# Patient Record
Sex: Female | Born: 1935 | Race: White | Hispanic: No | Smoking: Former smoker
Health system: Southern US, Community
[De-identification: ages and names within clinical notes are randomized; demographics above are authoritative.]

## PROBLEM LIST (undated history)

## (undated) DIAGNOSIS — Z96659 Presence of unspecified artificial knee joint: Secondary | ICD-10-CM

## (undated) DIAGNOSIS — I10 Essential (primary) hypertension: Secondary | ICD-10-CM

## (undated) DIAGNOSIS — M24542 Contracture, left hand: Secondary | ICD-10-CM

## (undated) DIAGNOSIS — M720 Palmar fascial fibromatosis [Dupuytren]: Secondary | ICD-10-CM

## (undated) DIAGNOSIS — E782 Mixed hyperlipidemia: Secondary | ICD-10-CM

## (undated) DIAGNOSIS — M810 Age-related osteoporosis without current pathological fracture: Secondary | ICD-10-CM

## (undated) DIAGNOSIS — I340 Nonrheumatic mitral (valve) insufficiency: Secondary | ICD-10-CM

## (undated) DIAGNOSIS — H3563 Retinal hemorrhage, bilateral: Secondary | ICD-10-CM

## (undated) DIAGNOSIS — I6330 Cerebral infarction due to thrombosis of unspecified cerebral artery: Secondary | ICD-10-CM

## (undated) DIAGNOSIS — R071 Chest pain on breathing: Secondary | ICD-10-CM

## (undated) DIAGNOSIS — H35372 Puckering of macula, left eye: Secondary | ICD-10-CM

## (undated) DIAGNOSIS — I110 Hypertensive heart disease with heart failure: Secondary | ICD-10-CM

## (undated) DIAGNOSIS — Z950 Presence of cardiac pacemaker: Secondary | ICD-10-CM

## (undated) DIAGNOSIS — M1990 Unspecified osteoarthritis, unspecified site: Secondary | ICD-10-CM

## (undated) DIAGNOSIS — J449 Chronic obstructive pulmonary disease, unspecified: Secondary | ICD-10-CM

## (undated) DIAGNOSIS — I5032 Chronic diastolic (congestive) heart failure: Secondary | ICD-10-CM

## (undated) DIAGNOSIS — K219 Gastro-esophageal reflux disease without esophagitis: Secondary | ICD-10-CM

## (undated) DIAGNOSIS — Z961 Presence of intraocular lens: Secondary | ICD-10-CM

## (undated) DIAGNOSIS — H43813 Vitreous degeneration, bilateral: Secondary | ICD-10-CM

## (undated) DIAGNOSIS — H53001 Unspecified amblyopia, right eye: Secondary | ICD-10-CM

## (undated) DIAGNOSIS — G250 Essential tremor: Secondary | ICD-10-CM

## (undated) DIAGNOSIS — H35033 Hypertensive retinopathy, bilateral: Secondary | ICD-10-CM

## (undated) DIAGNOSIS — I482 Chronic atrial fibrillation, unspecified: Secondary | ICD-10-CM

## (undated) DIAGNOSIS — H348322 Tributary (branch) retinal vein occlusion, left eye, stable: Secondary | ICD-10-CM

## (undated) DIAGNOSIS — G4734 Idiopathic sleep related nonobstructive alveolar hypoventilation: Secondary | ICD-10-CM

## (undated) DIAGNOSIS — J309 Allergic rhinitis, unspecified: Secondary | ICD-10-CM

## (undated) DIAGNOSIS — R609 Edema, unspecified: Secondary | ICD-10-CM

## (undated) HISTORY — DX: Contracture, left hand: M24.542

## (undated) HISTORY — DX: Palmar fascial fibromatosis (dupuytren): M72.0

## (undated) HISTORY — PX: FEMUR FRACTURE SURGERY: SHX633

## (undated) HISTORY — PX: TUBAL LIGATION: SHX77

## (undated) HISTORY — PX: PACEMAKER INSERTION: SHX728

## (undated) HISTORY — DX: Essential tremor: G25.0

## (undated) HISTORY — DX: Mixed hyperlipidemia: E78.2

## (undated) HISTORY — DX: Puckering of macula, left eye: H35.372

## (undated) HISTORY — DX: Gastro-esophageal reflux disease without esophagitis: K21.9

## (undated) HISTORY — DX: Edema, unspecified: R60.9

## (undated) HISTORY — DX: Hypertensive heart disease with heart failure: I11.0

## (undated) HISTORY — PX: BACK SURGERY: SHX140

## (undated) HISTORY — DX: Allergic rhinitis, unspecified: J30.9

## (undated) HISTORY — DX: Retinal hemorrhage, bilateral: H35.63

## (undated) HISTORY — DX: Tributary (branch) retinal vein occlusion, left eye, stable: H34.8322

## (undated) HISTORY — DX: Hypertensive retinopathy, bilateral: H35.033

## (undated) HISTORY — PX: SKIN GRAFT: SHX250

## (undated) HISTORY — DX: Age-related osteoporosis without current pathological fracture: M81.0

## (undated) HISTORY — PX: FINGER SURGERY: SHX640

## (undated) HISTORY — DX: Unspecified amblyopia, right eye: H53.001

## (undated) HISTORY — DX: Presence of unspecified artificial knee joint: Z96.659

## (undated) HISTORY — PX: OTHER SURGICAL HISTORY: SHX169

## (undated) HISTORY — DX: Chronic diastolic (congestive) heart failure: I50.32

## (undated) HISTORY — DX: Essential (primary) hypertension: I10

## (undated) HISTORY — DX: Presence of intraocular lens: Z96.1

## (undated) HISTORY — DX: Chronic atrial fibrillation, unspecified: I48.20

## (undated) HISTORY — DX: Chest pain on breathing: R07.1

## (undated) HISTORY — DX: Idiopathic sleep related nonobstructive alveolar hypoventilation: G47.34

## (undated) HISTORY — DX: Nonrheumatic mitral (valve) insufficiency: I34.0

## (undated) HISTORY — PX: REVISION TOTAL KNEE ARTHROPLASTY: SUR1280

## (undated) HISTORY — DX: Presence of cardiac pacemaker: Z95.0

## (undated) HISTORY — PX: ABDOMINAL HYSTERECTOMY: SHX81

## (undated) HISTORY — DX: Chronic obstructive pulmonary disease, unspecified: J44.9

## (undated) HISTORY — DX: Vitreous degeneration, bilateral: H43.813

## (undated) HISTORY — PX: TONSILLECTOMY: SUR1361

## (undated) HISTORY — DX: Unspecified osteoarthritis, unspecified site: M19.90

## (undated) HISTORY — DX: Cerebral infarction due to thrombosis of unspecified cerebral artery: I63.30

## (undated) HISTORY — PX: REPLACEMENT TOTAL KNEE: SUR1224

---

## 2006-01-14 ENCOUNTER — Encounter: Admission: RE | Admit: 2006-01-14 | Discharge: 2006-01-14 | Payer: Self-pay | Admitting: Orthopedic Surgery

## 2007-01-07 ENCOUNTER — Encounter: Admission: RE | Admit: 2007-01-07 | Discharge: 2007-01-07 | Payer: Self-pay | Admitting: Orthopedic Surgery

## 2007-01-14 ENCOUNTER — Encounter: Admission: RE | Admit: 2007-01-14 | Discharge: 2007-01-14 | Payer: Self-pay | Admitting: Orthopedic Surgery

## 2007-01-27 ENCOUNTER — Encounter: Admission: RE | Admit: 2007-01-27 | Discharge: 2007-01-27 | Payer: Self-pay | Admitting: Orthopedic Surgery

## 2007-02-14 ENCOUNTER — Encounter: Admission: RE | Admit: 2007-02-14 | Discharge: 2007-02-14 | Payer: Self-pay | Admitting: Orthopedic Surgery

## 2007-03-23 IMAGING — RF DG MYELOGRAM LUMBAR
13 series · 13 of 13 positions shown · IV contrast (omnipaque)
Comparison: none

CLINICAL DATA: Left low back pain.  
 LUMBAR MYELOGRAM:
 Lumbar puncture was performed from a right sided approach to midline at the L2-3 interlaminar spaces using a 22 gauge spinal needle.  18 cc of Omnipaque 180 were instilled.
 The spinal canal appears widely patent throughout.  Root sleeves fill normally bilaterally.  No extradural defects of significance.  There are minimal disk bulges at L3-4, L4-5, and L5-S1.
TECHNIQUE: Multidetector CT imaging of the lumbar spine was performed after intrathecal injection of contrast.  Multiplanar CT image reconstructions were also generated.
 T12-L1:  Normal interspace.
 L1-2:  Normal interspace.
 L2-3:  Minimal disk bulge but no herniation or stenosis.  
 L3-4:  Superior endplate Schmorl?s node at L3-4.  Mild disk bulge with slight prominence in the left foraminal region.  It would be conceivable that the left L3 nerve root could be irritated, but distinct compression is not established.
 L4-5:  Minimal disk bulge but no herniation or stenosis. 
 L5-S1:  Disk degeneration with  minimal bulge.  No effect upon the thecal sac or S1 nerve roots.  Foramina appear sufficiently patent.

[Series 1: (hospital) · 1 of 1 slices shown]
[im 1/1]
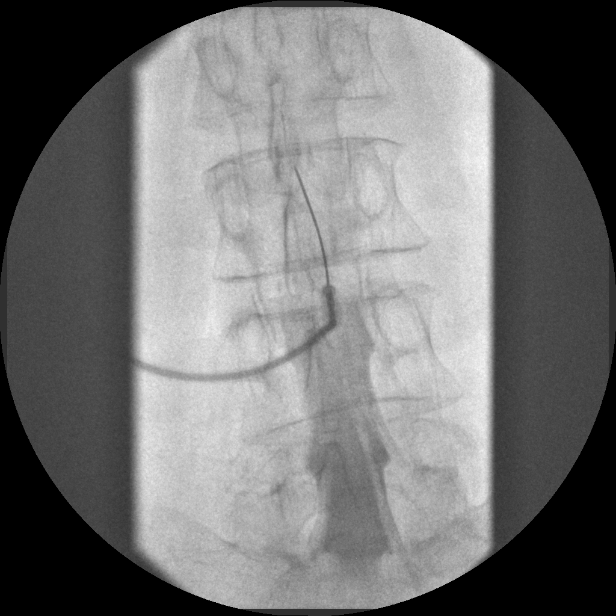

[Series 2: myelogram  white · 1 of 1 slices shown (1 of 12)]
[im 1/1]
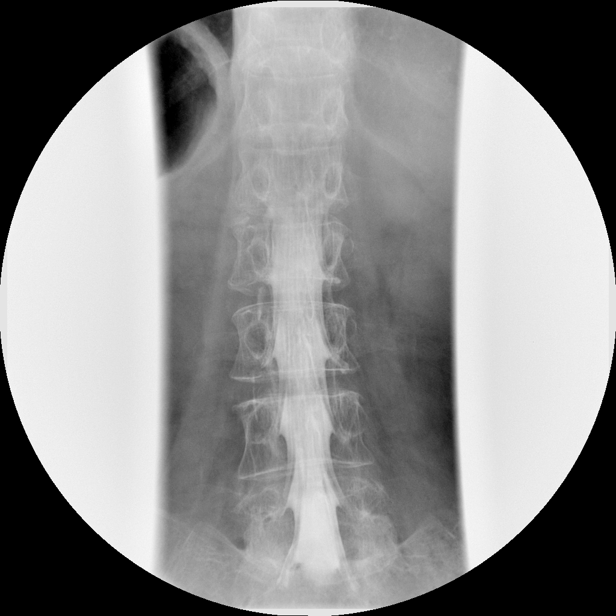

[Series 3: myelogram  white · 1 of 1 slices shown (2 of 12)]
[im 1/1]
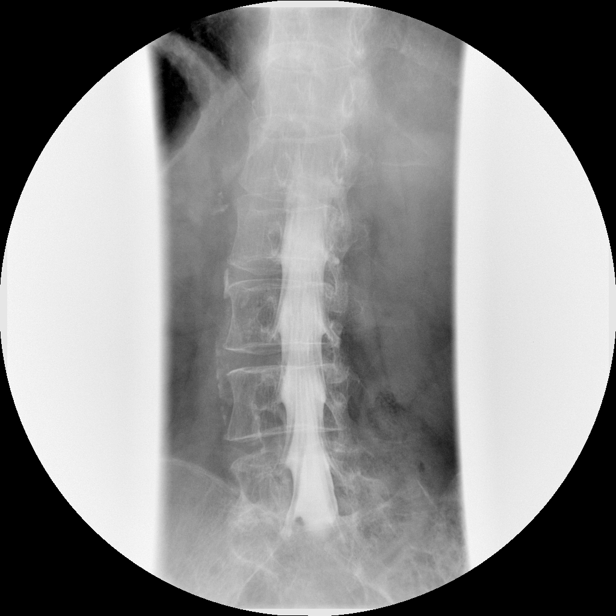

[Series 4: myelogram  white · 1 of 1 slices shown (3 of 12)]
[im 1/1]
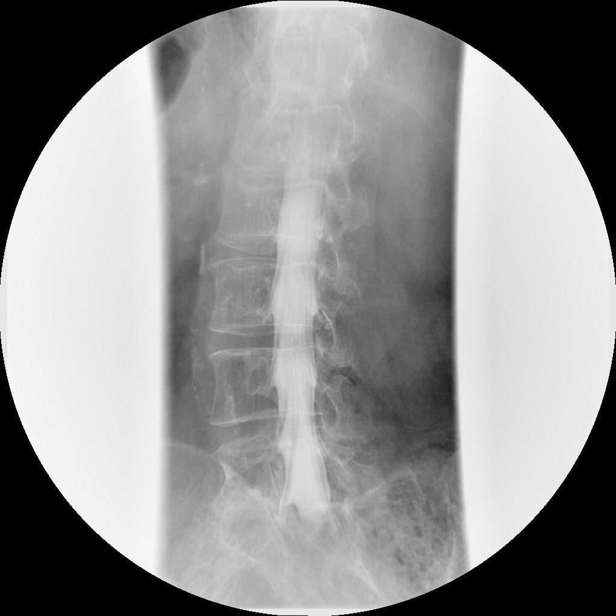

[Series 5: myelogram  white · 1 of 1 slices shown (4 of 12)]
[im 1/1]
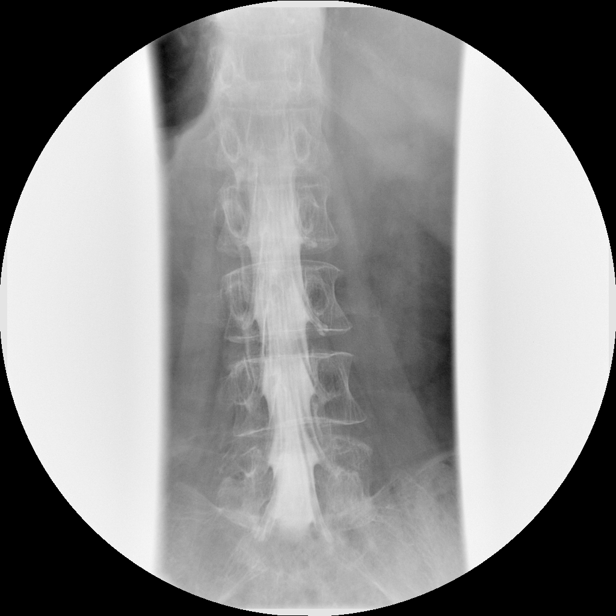

[Series 6: myelogram  white · 1 of 1 slices shown (5 of 12)]
[im 1/1]
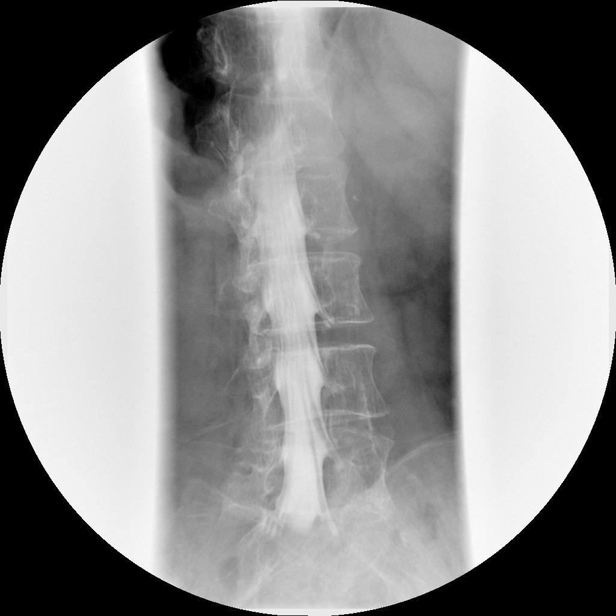

[Series 7: myelogram  white · 1 of 1 slices shown (6 of 12)]
[im 1/1]
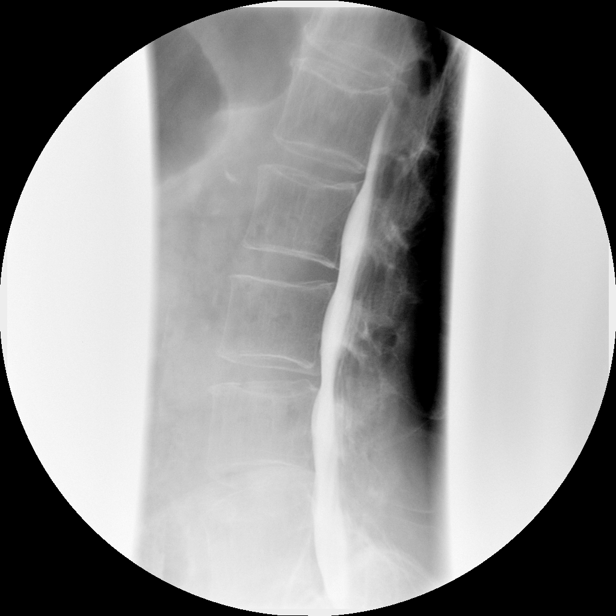

[Series 8: myelogram  white · 1 of 1 slices shown (7 of 12)]
[im 1/1]
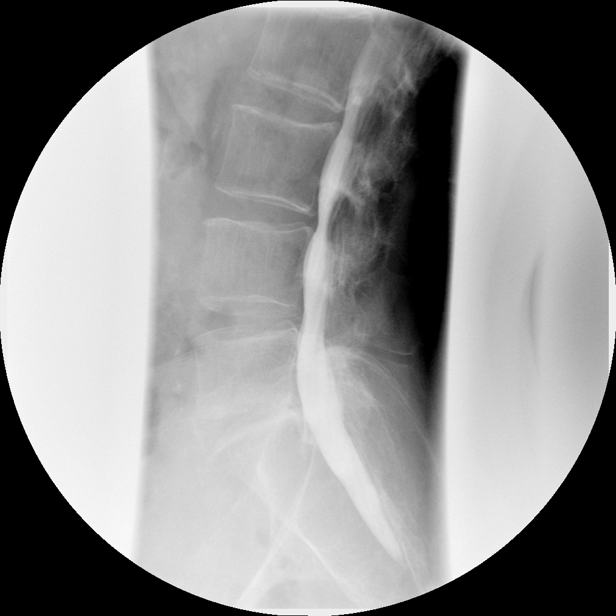

[Series 9: myelogram  white · 1 of 1 slices shown (8 of 12)]
[im 1/1]
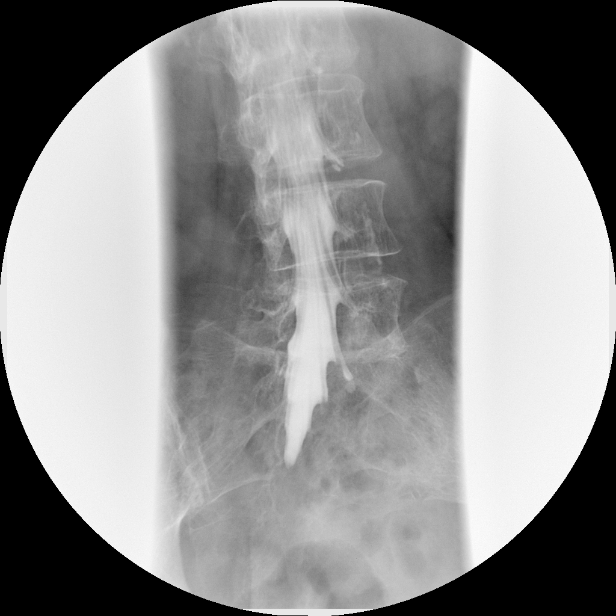

[Series 10: myelogram  white · 1 of 1 slices shown (9 of 12)]
[im 1/1]
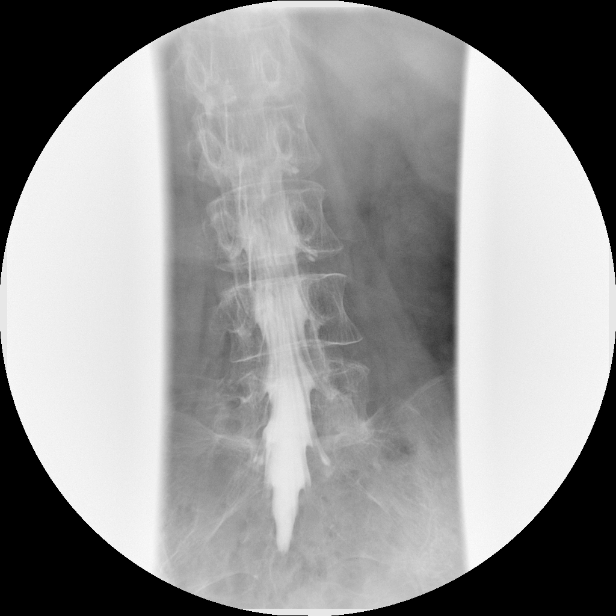

[Series 11: myelogram  white · 1 of 1 slices shown (10 of 12)]
[im 1/1]
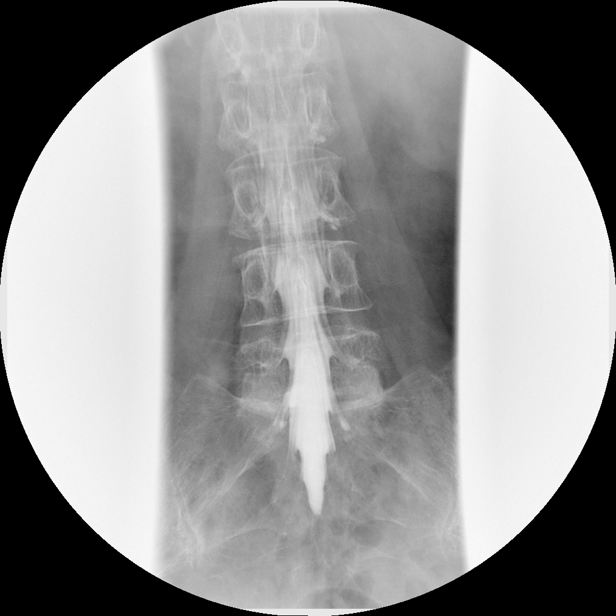

[Series 12: myelogram  white · 1 of 1 slices shown (11 of 12)]
[im 1/1]
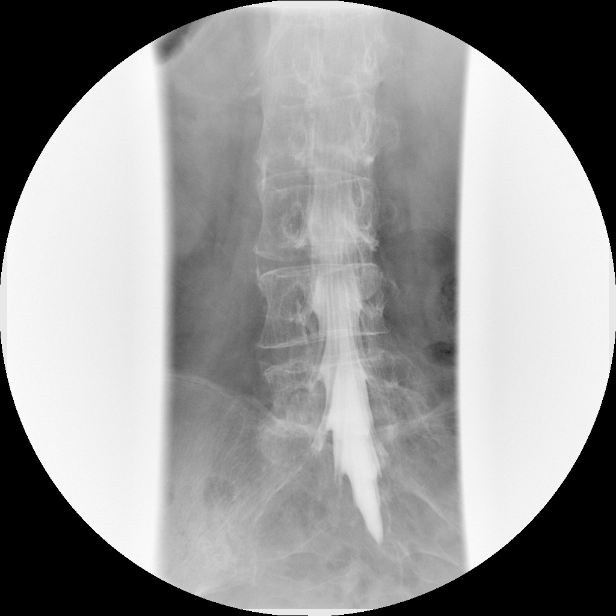

[Series 13: myelogram  white · 1 of 1 slices shown (12 of 12)]
[im 1/1]
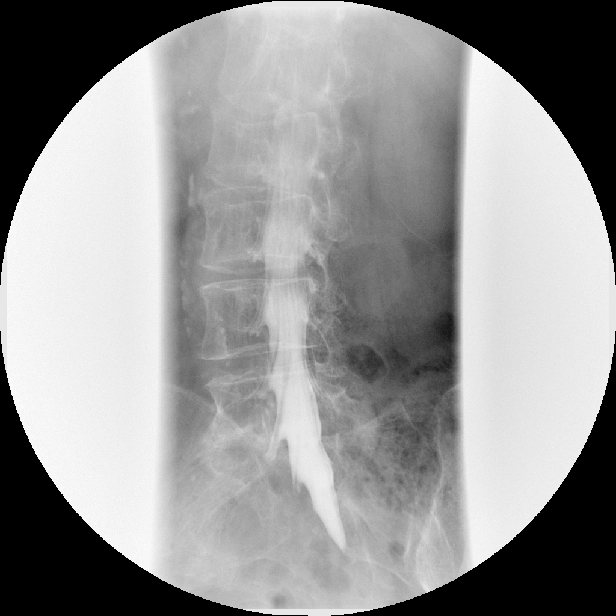

[13 of 13 positions shown; findings below may reference images not displayed]

IMPRESSION: 1.  No compressive pathology demonstrated.  Minimal disk bulges at L3-4, L4-5, and L5-S1.
 CT LUMBAR SPINE WITH CONTRAST (POST MYELOGRAM):
IMPRESSION: Minor degenerative changes, as described.  No definitely significant lesion demonstrated.  There is slight prominence of disk material on the left foraminal to extraforaminal region that could conceivably irritate the left L3 nerve root, but distinct neural compression is not established.

## 2009-11-09 ENCOUNTER — Ambulatory Visit: Payer: Self-pay | Admitting: Internal Medicine

## 2009-11-09 ENCOUNTER — Inpatient Hospital Stay (HOSPITAL_COMMUNITY): Admission: RE | Admit: 2009-11-09 | Discharge: 2009-11-14 | Payer: Self-pay | Admitting: Orthopedic Surgery

## 2010-03-07 ENCOUNTER — Encounter: Admission: RE | Admit: 2010-03-07 | Discharge: 2010-03-07 | Payer: Self-pay | Admitting: Orthopedic Surgery

## 2010-08-28 ENCOUNTER — Inpatient Hospital Stay (HOSPITAL_COMMUNITY): Admission: RE | Admit: 2010-08-28 | Discharge: 2010-08-31 | Payer: Self-pay | Admitting: Orthopedic Surgery

## 2010-08-28 ENCOUNTER — Encounter (INDEPENDENT_AMBULATORY_CARE_PROVIDER_SITE_OTHER): Payer: Self-pay | Admitting: Orthopedic Surgery

## 2010-11-12 ENCOUNTER — Encounter: Payer: Self-pay | Admitting: Orthopedic Surgery

## 2011-01-02 LAB — BASIC METABOLIC PANEL
BUN: 3 mg/dL — ABNORMAL LOW (ref 6–23)
BUN: 5 mg/dL — ABNORMAL LOW (ref 6–23)
BUN: 5 mg/dL — ABNORMAL LOW (ref 6–23)
CO2: 26 mEq/L (ref 19–32)
CO2: 27 mEq/L (ref 19–32)
CO2: 29 mEq/L (ref 19–32)
Calcium: 8.1 mg/dL — ABNORMAL LOW (ref 8.4–10.5)
Calcium: 8.2 mg/dL — ABNORMAL LOW (ref 8.4–10.5)
Calcium: 9 mg/dL (ref 8.4–10.5)
Chloride: 105 mEq/L (ref 96–112)
Chloride: 106 mEq/L (ref 96–112)
Chloride: 108 mEq/L (ref 96–112)
Creatinine, Ser: 0.52 mg/dL (ref 0.4–1.2)
Creatinine, Ser: 0.53 mg/dL (ref 0.4–1.2)
Creatinine, Ser: 0.59 mg/dL (ref 0.4–1.2)
GFR calc Af Amer: >60 mL/min (ref >60)
GFR calc Af Amer: >60 mL/min (ref >60)
GFR calc Af Amer: >60 mL/min (ref >60)
GFR calc non Af Amer: >60 mL/min (ref >60)
GFR calc non Af Amer: >60 mL/min (ref >60)
GFR calc non Af Amer: >60 mL/min (ref >60)
Glucose, Bld: 117 mg/dL — ABNORMAL HIGH (ref 70–99)
Glucose, Bld: 90 mg/dL (ref 70–99)
Glucose, Bld: 97 mg/dL (ref 70–99)
Potassium: 3.9 mEq/L (ref 3.5–5.1)
Potassium: 4.1 mEq/L (ref 3.5–5.1)
Potassium: 4.2 mEq/L (ref 3.5–5.1)
Sodium: 135 mEq/L (ref 135–145)
Sodium: 135 mEq/L (ref 135–145)
Sodium: 141 mEq/L (ref 135–145)

## 2011-01-02 LAB — CBC
HCT: 23.3 % — ABNORMAL LOW (ref 36.0–46.0)
HCT: 27.2 % — ABNORMAL LOW (ref 36.0–46.0)
HCT: 28.1 % — ABNORMAL LOW (ref 36.0–46.0)
HCT: 36.7 % (ref 36.0–46.0)
Hemoglobin: 12.7 g/dL (ref 12.0–15.0)
Hemoglobin: 8.1 g/dL — ABNORMAL LOW (ref 12.0–15.0)
Hemoglobin: 9.3 g/dL — ABNORMAL LOW (ref 12.0–15.0)
Hemoglobin: 9.8 g/dL — ABNORMAL LOW (ref 12.0–15.0)
MCH: 27 pg (ref 26.0–34.0)
MCH: 27.2 pg (ref 26.0–34.0)
MCH: 27.4 pg (ref 26.0–34.0)
MCH: 27.7 pg (ref 26.0–34.0)
MCHC: 34.2 g/dL (ref 30.0–36.0)
MCHC: 34.6 g/dL (ref 30.0–36.0)
MCHC: 34.8 g/dL (ref 30.0–36.0)
MCHC: 34.9 g/dL (ref 30.0–36.0)
MCV: 78.6 fL (ref 78.0–100.0)
MCV: 78.7 fL (ref 78.0–100.0)
MCV: 79.1 fL (ref 78.0–100.0)
MCV: 79.4 fL (ref 78.0–100.0)
Platelets: 125 10*3/uL — ABNORMAL LOW (ref 150–400)
Platelets: 156 10*3/uL (ref 150–400)
Platelets: 157 10*3/uL (ref 150–400)
Platelets: 232 10*3/uL (ref 150–400)
RBC: 2.96 MIL/uL — ABNORMAL LOW (ref 3.87–5.11)
RBC: 3.44 MIL/uL — ABNORMAL LOW (ref 3.87–5.11)
RBC: 3.54 MIL/uL — ABNORMAL LOW (ref 3.87–5.11)
RBC: 4.67 MIL/uL (ref 3.87–5.11)
RDW: 15.8 % — ABNORMAL HIGH (ref 11.5–15.5)
RDW: 15.8 % — ABNORMAL HIGH (ref 11.5–15.5)
RDW: 15.9 % — ABNORMAL HIGH (ref 11.5–15.5)
RDW: 15.9 % — ABNORMAL HIGH (ref 11.5–15.5)
WBC: 7.4 10*3/uL (ref 4.0–10.5)
WBC: 7.5 10*3/uL (ref 4.0–10.5)
WBC: 8.7 10*3/uL (ref 4.0–10.5)
WBC: 9 10*3/uL (ref 4.0–10.5)

## 2011-01-02 LAB — ANAEROBIC CULTURE: Gram Stain: NONE SEEN

## 2011-01-02 LAB — COMPREHENSIVE METABOLIC PANEL
ALT: 21 U/L (ref 0–35)
AST: 23 U/L (ref 0–37)
Albumin: 4.2 g/dL (ref 3.5–5.2)
Alkaline Phosphatase: 130 U/L — ABNORMAL HIGH (ref 39–117)
BUN: 5 mg/dL — ABNORMAL LOW (ref 6–23)
CO2: 28 mEq/L (ref 19–32)
Calcium: 9.4 mg/dL (ref 8.4–10.5)
Chloride: 106 mEq/L (ref 96–112)
Creatinine, Ser: 0.55 mg/dL (ref 0.4–1.2)
GFR calc Af Amer: >60 mL/min (ref >60)
GFR calc non Af Amer: >60 mL/min (ref >60)
Glucose, Bld: 82 mg/dL (ref 70–99)
Potassium: 3.9 mEq/L (ref 3.5–5.1)
Sodium: 140 mEq/L (ref 135–145)
Total Bilirubin: 0.4 mg/dL (ref 0.3–1.2)
Total Protein: 7.2 g/dL (ref 6.0–8.3)

## 2011-01-02 LAB — URINALYSIS, ROUTINE W REFLEX MICROSCOPIC
Bilirubin Urine: NEGATIVE
Glucose, UA: NEGATIVE mg/dL
Hgb urine dipstick: NEGATIVE
Ketones, ur: NEGATIVE mg/dL
Nitrite: NEGATIVE
Protein, ur: NEGATIVE mg/dL
Specific Gravity, Urine: 1.011 (ref 1.005–1.030)
Urobilinogen, UA: 0.2 mg/dL (ref 0.0–1.0)
pH: 5.5 (ref 5.0–8.0)

## 2011-01-02 LAB — DIFFERENTIAL
Basophils Absolute: 0 10*3/uL (ref 0.0–0.1)
Basophils Relative: 1 % (ref 0–1)
Eosinophils Absolute: 0.2 10*3/uL (ref 0.0–0.7)
Eosinophils Relative: 3 % (ref 0–5)
Lymphocytes Relative: 31 % (ref 12–46)
Lymphs Abs: 2.4 10*3/uL (ref 0.7–4.0)
Monocytes Absolute: 0.8 10*3/uL (ref 0.1–1.0)
Monocytes Relative: 10 % (ref 3–12)
Neutro Abs: 4.1 10*3/uL (ref 1.7–7.7)
Neutrophils Relative %: 55 % (ref 43–77)

## 2011-01-02 LAB — CROSSMATCH
ABO/RH(D): A NEG
Antibody Screen: NEGATIVE
Unit division: 0
Unit division: 0

## 2011-01-02 LAB — URINE CULTURE
Colony Count: 3000
Culture  Setup Time: 201111012156

## 2011-01-02 LAB — GLUCOSE, CAPILLARY: Glucose-Capillary: 171 mg/dL — ABNORMAL HIGH (ref 70–99)

## 2011-01-02 LAB — WOUND CULTURE
Culture: NO GROWTH
Gram Stain: NONE SEEN

## 2011-01-02 LAB — SURGICAL PCR SCREEN
MRSA, PCR: POSITIVE — AB
Staphylococcus aureus: POSITIVE — AB

## 2011-01-02 LAB — PROTIME-INR
INR: 1.1 (ref 0.00–1.49)
Prothrombin Time: 14.4 seconds (ref 11.6–15.2)

## 2011-01-02 LAB — APTT: aPTT: 33 seconds (ref 24–37)

## 2011-01-07 LAB — URINALYSIS, ROUTINE W REFLEX MICROSCOPIC
Bilirubin Urine: NEGATIVE
Glucose, UA: NEGATIVE mg/dL
Hgb urine dipstick: NEGATIVE
Ketones, ur: NEGATIVE mg/dL
Nitrite: NEGATIVE
Protein, ur: NEGATIVE mg/dL
Specific Gravity, Urine: 1.011 (ref 1.005–1.030)
Urobilinogen, UA: 1 mg/dL (ref 0.0–1.0)
pH: 7 (ref 5.0–8.0)

## 2011-01-07 LAB — URINE CULTURE
Colony Count: NO GROWTH
Culture: NO GROWTH

## 2011-01-07 LAB — TYPE AND SCREEN
ABO/RH(D): A NEG
Antibody Screen: NEGATIVE

## 2011-01-07 LAB — COMPREHENSIVE METABOLIC PANEL
ALT: 28 U/L (ref 0–35)
AST: 25 U/L (ref 0–37)
Albumin: 4.3 g/dL (ref 3.5–5.2)
Alkaline Phosphatase: 113 U/L (ref 39–117)
BUN: 7 mg/dL (ref 6–23)
CO2: 31 mEq/L (ref 19–32)
Calcium: 9.5 mg/dL (ref 8.4–10.5)
Chloride: 104 mEq/L (ref 96–112)
Creatinine, Ser: 0.51 mg/dL (ref 0.4–1.2)
GFR calc Af Amer: >60 mL/min (ref >60)
GFR calc non Af Amer: >60 mL/min (ref >60)
Glucose, Bld: 82 mg/dL (ref 70–99)
Potassium: 4.2 mEq/L (ref 3.5–5.1)
Sodium: 141 mEq/L (ref 135–145)
Total Bilirubin: 0.4 mg/dL (ref 0.3–1.2)
Total Protein: 7 g/dL (ref 6.0–8.3)

## 2011-01-07 LAB — DIFFERENTIAL
Basophils Absolute: 0 10*3/uL (ref 0.0–0.1)
Basophils Relative: 0 % (ref 0–1)
Eosinophils Absolute: 0.4 10*3/uL (ref 0.0–0.7)
Eosinophils Relative: 5 % (ref 0–5)
Lymphocytes Relative: 27 % (ref 12–46)
Lymphs Abs: 2.1 10*3/uL (ref 0.7–4.0)
Monocytes Absolute: 0.6 10*3/uL (ref 0.1–1.0)
Monocytes Relative: 9 % (ref 3–12)
Neutro Abs: 4.6 10*3/uL (ref 1.7–7.7)
Neutrophils Relative %: 60 % (ref 43–77)

## 2011-01-07 LAB — CBC
HCT: 40.9 % (ref 36.0–46.0)
Hemoglobin: 14.2 g/dL (ref 12.0–15.0)
MCHC: 34.8 g/dL (ref 30.0–36.0)
MCV: 88.3 fL (ref 78.0–100.0)
Platelets: 244 10*3/uL (ref 150–400)
RBC: 4.63 MIL/uL (ref 3.87–5.11)
RDW: 13.2 % (ref 11.5–15.5)
WBC: 7.6 10*3/uL (ref 4.0–10.5)

## 2011-01-07 LAB — PROTIME-INR
INR: 1.08 (ref 0.00–1.49)
Prothrombin Time: 13.9 seconds (ref 11.6–15.2)

## 2011-01-07 LAB — APTT: aPTT: 30 seconds (ref 24–37)

## 2011-01-07 LAB — ABO/RH: ABO/RH(D): A NEG

## 2011-01-08 LAB — DIFFERENTIAL
Basophils Absolute: 0.1 10*3/uL (ref 0.0–0.1)
Basophils Relative: 1 % (ref 0–1)
Eosinophils Absolute: 0.7 10*3/uL (ref 0.0–0.7)
Eosinophils Relative: 6 % — ABNORMAL HIGH (ref 0–5)
Lymphocytes Relative: 22 % (ref 12–46)
Lymphs Abs: 2.4 10*3/uL (ref 0.7–4.0)
Monocytes Absolute: 0.7 10*3/uL (ref 0.1–1.0)
Monocytes Relative: 6 % (ref 3–12)
Neutro Abs: 7.1 10*3/uL (ref 1.7–7.7)
Neutrophils Relative %: 65 % (ref 43–77)

## 2011-01-08 LAB — CARDIAC PANEL(CRET KIN+CKTOT+MB+TROPI)
CK, MB: 2 ng/mL (ref 0.3–4.0)
CK, MB: 2.3 ng/mL (ref 0.3–4.0)
Relative Index: 1.8 (ref 0.0–2.5)
Relative Index: INVALID (ref 0.0–2.5)
Total CK: 130 U/L (ref 7–177)
Total CK: 46 U/L (ref 7–177)
Troponin I: 0.01 ng/mL (ref 0.00–0.06)
Troponin I: 0.03 ng/mL (ref 0.00–0.06)

## 2011-01-08 LAB — BASIC METABOLIC PANEL
BUN: 5 mg/dL — ABNORMAL LOW (ref 6–23)
BUN: 5 mg/dL — ABNORMAL LOW (ref 6–23)
BUN: 5 mg/dL — ABNORMAL LOW (ref 6–23)
BUN: 6 mg/dL (ref 6–23)
CO2: 27 mEq/L (ref 19–32)
CO2: 28 mEq/L (ref 19–32)
CO2: 28 mEq/L (ref 19–32)
CO2: 29 mEq/L (ref 19–32)
Calcium: 8 mg/dL — ABNORMAL LOW (ref 8.4–10.5)
Calcium: 8.3 mg/dL — ABNORMAL LOW (ref 8.4–10.5)
Calcium: 8.4 mg/dL (ref 8.4–10.5)
Calcium: 8.7 mg/dL (ref 8.4–10.5)
Chloride: 101 mEq/L (ref 96–112)
Chloride: 101 mEq/L (ref 96–112)
Chloride: 102 mEq/L (ref 96–112)
Chloride: 107 mEq/L (ref 96–112)
Creatinine, Ser: 0.43 mg/dL (ref 0.4–1.2)
Creatinine, Ser: 0.48 mg/dL (ref 0.4–1.2)
Creatinine, Ser: 0.52 mg/dL (ref 0.4–1.2)
Creatinine, Ser: 0.53 mg/dL (ref 0.4–1.2)
GFR calc Af Amer: >60 mL/min (ref >60)
GFR calc Af Amer: >60 mL/min (ref >60)
GFR calc Af Amer: >60 mL/min (ref >60)
GFR calc Af Amer: >60 mL/min (ref >60)
GFR calc non Af Amer: >60 mL/min (ref >60)
GFR calc non Af Amer: >60 mL/min (ref >60)
GFR calc non Af Amer: >60 mL/min (ref >60)
GFR calc non Af Amer: >60 mL/min (ref >60)
Glucose, Bld: 103 mg/dL — ABNORMAL HIGH (ref 70–99)
Glucose, Bld: 106 mg/dL — ABNORMAL HIGH (ref 70–99)
Glucose, Bld: 121 mg/dL — ABNORMAL HIGH (ref 70–99)
Glucose, Bld: 123 mg/dL — ABNORMAL HIGH (ref 70–99)
Potassium: 3.6 mEq/L (ref 3.5–5.1)
Potassium: 3.8 mEq/L (ref 3.5–5.1)
Potassium: 3.8 mEq/L (ref 3.5–5.1)
Potassium: 4 mEq/L (ref 3.5–5.1)
Sodium: 135 mEq/L (ref 135–145)
Sodium: 137 mEq/L (ref 135–145)
Sodium: 137 mEq/L (ref 135–145)
Sodium: 139 mEq/L (ref 135–145)

## 2011-01-08 LAB — CBC
HCT: 25 % — ABNORMAL LOW (ref 36.0–46.0)
HCT: 25.1 % — ABNORMAL LOW (ref 36.0–46.0)
HCT: 26 % — ABNORMAL LOW (ref 36.0–46.0)
HCT: 27.4 % — ABNORMAL LOW (ref 36.0–46.0)
HCT: 28.3 % — ABNORMAL LOW (ref 36.0–46.0)
Hemoglobin: 10 g/dL — ABNORMAL LOW (ref 12.0–15.0)
Hemoglobin: 8.7 g/dL — ABNORMAL LOW (ref 12.0–15.0)
Hemoglobin: 8.8 g/dL — ABNORMAL LOW (ref 12.0–15.0)
Hemoglobin: 8.9 g/dL — ABNORMAL LOW (ref 12.0–15.0)
Hemoglobin: 9.6 g/dL — ABNORMAL LOW (ref 12.0–15.0)
MCHC: 34.3 g/dL (ref 30.0–36.0)
MCHC: 34.5 g/dL (ref 30.0–36.0)
MCHC: 34.9 g/dL (ref 30.0–36.0)
MCHC: 35.1 g/dL (ref 30.0–36.0)
MCHC: 35.2 g/dL (ref 30.0–36.0)
MCV: 89 fL (ref 78.0–100.0)
MCV: 89.1 fL (ref 78.0–100.0)
MCV: 89.2 fL (ref 78.0–100.0)
MCV: 90.4 fL (ref 78.0–100.0)
MCV: 90.7 fL (ref 78.0–100.0)
Platelets: 124 10*3/uL — ABNORMAL LOW (ref 150–400)
Platelets: 130 10*3/uL — ABNORMAL LOW (ref 150–400)
Platelets: 163 10*3/uL (ref 150–400)
Platelets: 166 10*3/uL (ref 150–400)
Platelets: 246 K/uL (ref 150–400)
RBC: 2.77 MIL/uL — ABNORMAL LOW (ref 3.87–5.11)
RBC: 2.81 MIL/uL — ABNORMAL LOW (ref 3.87–5.11)
RBC: 2.92 MIL/uL — ABNORMAL LOW (ref 3.87–5.11)
RBC: 3.03 MIL/uL — ABNORMAL LOW (ref 3.87–5.11)
RBC: 3.19 MIL/uL — ABNORMAL LOW (ref 3.87–5.11)
RDW: 13.3 % (ref 11.5–15.5)
RDW: 13.4 % (ref 11.5–15.5)
RDW: 13.4 % (ref 11.5–15.5)
RDW: 13.8 % (ref 11.5–15.5)
RDW: 13.8 % (ref 11.5–15.5)
WBC: 10.9 K/uL — ABNORMAL HIGH (ref 4.0–10.5)
WBC: 8.4 10*3/uL (ref 4.0–10.5)
WBC: 8.9 10*3/uL (ref 4.0–10.5)
WBC: 9.5 10*3/uL (ref 4.0–10.5)
WBC: 9.6 10*3/uL (ref 4.0–10.5)

## 2011-01-08 LAB — PREPARE RBC (CROSSMATCH)

## 2011-01-08 LAB — HEPARIN INDUCED THROMBOCYTOPENIA PNL
Heparin Induced Plt Ab: POSITIVE
Patient O.D.: 0.421
Serotonin Release: 0 % release (ref <20)

## 2011-05-18 DIAGNOSIS — M1990 Unspecified osteoarthritis, unspecified site: Secondary | ICD-10-CM

## 2011-05-18 DIAGNOSIS — M810 Age-related osteoporosis without current pathological fracture: Secondary | ICD-10-CM

## 2011-05-18 DIAGNOSIS — Z950 Presence of cardiac pacemaker: Secondary | ICD-10-CM | POA: Insufficient documentation

## 2011-05-18 DIAGNOSIS — I6330 Cerebral infarction due to thrombosis of unspecified cerebral artery: Secondary | ICD-10-CM

## 2011-05-18 DIAGNOSIS — G250 Essential tremor: Secondary | ICD-10-CM

## 2011-05-18 HISTORY — DX: Presence of cardiac pacemaker: Z95.0

## 2011-05-18 HISTORY — DX: Essential tremor: G25.0

## 2011-05-18 HISTORY — DX: Age-related osteoporosis without current pathological fracture: M81.0

## 2011-05-18 HISTORY — DX: Cerebral infarction due to thrombosis of unspecified cerebral artery: I63.30

## 2011-05-18 HISTORY — DX: Unspecified osteoarthritis, unspecified site: M19.90

## 2013-08-10 DIAGNOSIS — Z96659 Presence of unspecified artificial knee joint: Secondary | ICD-10-CM

## 2013-08-10 HISTORY — DX: Presence of unspecified artificial knee joint: Z96.659

## 2013-12-29 DIAGNOSIS — M25559 Pain in unspecified hip: Secondary | ICD-10-CM

## 2013-12-29 DIAGNOSIS — Z96659 Presence of unspecified artificial knee joint: Secondary | ICD-10-CM

## 2013-12-29 HISTORY — DX: Presence of unspecified artificial knee joint: Z96.659

## 2013-12-29 HISTORY — DX: Pain in unspecified hip: M25.559

## 2014-04-08 DIAGNOSIS — M79672 Pain in left foot: Secondary | ICD-10-CM

## 2014-04-08 HISTORY — DX: Pain in left foot: M79.672

## 2014-07-26 ENCOUNTER — Telehealth: Payer: Self-pay | Admitting: Critical Care Medicine

## 2014-07-26 NOTE — Telephone Encounter (Signed)
Called pt. Aware PW not accepting new pt's in [place] office. She r/s her [place] appt to later time on 11/17. Nothing further needed

## 2014-07-27 ENCOUNTER — Institutional Professional Consult (permissible substitution): Payer: Self-pay | Admitting: Critical Care Medicine

## 2014-09-06 ENCOUNTER — Encounter: Payer: Self-pay | Admitting: Critical Care Medicine

## 2014-09-07 ENCOUNTER — Institutional Professional Consult (permissible substitution): Payer: Self-pay | Admitting: Critical Care Medicine

## 2015-11-08 DIAGNOSIS — H53001 Unspecified amblyopia, right eye: Secondary | ICD-10-CM

## 2015-11-08 DIAGNOSIS — H35033 Hypertensive retinopathy, bilateral: Secondary | ICD-10-CM

## 2015-11-08 DIAGNOSIS — H35372 Puckering of macula, left eye: Secondary | ICD-10-CM

## 2015-11-08 DIAGNOSIS — Z961 Presence of intraocular lens: Secondary | ICD-10-CM

## 2015-11-08 DIAGNOSIS — H348322 Tributary (branch) retinal vein occlusion, left eye, stable: Secondary | ICD-10-CM | POA: Insufficient documentation

## 2015-11-08 DIAGNOSIS — H43813 Vitreous degeneration, bilateral: Secondary | ICD-10-CM

## 2015-11-08 HISTORY — DX: Vitreous degeneration, bilateral: H43.813

## 2015-11-08 HISTORY — DX: Puckering of macula, left eye: H35.372

## 2015-11-08 HISTORY — DX: Tributary (branch) retinal vein occlusion, left eye, stable: H34.8322

## 2015-11-08 HISTORY — DX: Unspecified amblyopia, right eye: H53.001

## 2015-11-08 HISTORY — DX: Presence of intraocular lens: Z96.1

## 2015-11-08 HISTORY — DX: Hypertensive retinopathy, bilateral: H35.033

## 2015-11-22 DIAGNOSIS — I110 Hypertensive heart disease with heart failure: Secondary | ICD-10-CM

## 2015-11-22 DIAGNOSIS — R0789 Other chest pain: Secondary | ICD-10-CM

## 2015-11-22 DIAGNOSIS — I5032 Chronic diastolic (congestive) heart failure: Secondary | ICD-10-CM

## 2015-11-22 DIAGNOSIS — I482 Chronic atrial fibrillation, unspecified: Secondary | ICD-10-CM

## 2015-11-22 DIAGNOSIS — R071 Chest pain on breathing: Secondary | ICD-10-CM

## 2015-11-22 HISTORY — DX: Chronic atrial fibrillation, unspecified: I48.20

## 2015-11-22 HISTORY — DX: Hypertensive heart disease with heart failure: I11.0

## 2015-11-22 HISTORY — DX: Other chest pain: R07.89

## 2015-11-22 HISTORY — DX: Chronic diastolic (congestive) heart failure: I50.32

## 2016-01-08 DIAGNOSIS — I340 Nonrheumatic mitral (valve) insufficiency: Secondary | ICD-10-CM | POA: Insufficient documentation

## 2016-01-08 HISTORY — DX: Nonrheumatic mitral (valve) insufficiency: I34.0

## 2016-02-01 DIAGNOSIS — E782 Mixed hyperlipidemia: Secondary | ICD-10-CM

## 2016-02-01 DIAGNOSIS — J441 Chronic obstructive pulmonary disease with (acute) exacerbation: Secondary | ICD-10-CM | POA: Insufficient documentation

## 2016-02-01 DIAGNOSIS — J45901 Unspecified asthma with (acute) exacerbation: Secondary | ICD-10-CM | POA: Insufficient documentation

## 2016-02-01 DIAGNOSIS — I10 Essential (primary) hypertension: Secondary | ICD-10-CM

## 2016-02-01 HISTORY — DX: Chronic obstructive pulmonary disease with (acute) exacerbation: J44.1

## 2016-02-01 HISTORY — DX: Essential (primary) hypertension: I10

## 2016-02-01 HISTORY — DX: Mixed hyperlipidemia: E78.2

## 2016-02-01 HISTORY — DX: Unspecified asthma with (acute) exacerbation: J45.901

## 2016-05-22 DIAGNOSIS — R42 Dizziness and giddiness: Secondary | ICD-10-CM | POA: Insufficient documentation

## 2016-05-22 DIAGNOSIS — Z87898 Personal history of other specified conditions: Secondary | ICD-10-CM

## 2016-05-22 HISTORY — DX: Dizziness and giddiness: R42

## 2016-05-22 HISTORY — DX: Personal history of other specified conditions: Z87.898

## 2016-06-26 DIAGNOSIS — H3563 Retinal hemorrhage, bilateral: Secondary | ICD-10-CM

## 2016-06-26 DIAGNOSIS — H43813 Vitreous degeneration, bilateral: Secondary | ICD-10-CM

## 2016-06-26 DIAGNOSIS — H35372 Puckering of macula, left eye: Secondary | ICD-10-CM

## 2016-06-26 HISTORY — DX: Puckering of macula, left eye: H35.372

## 2016-06-26 HISTORY — DX: Vitreous degeneration, bilateral: H43.813

## 2016-06-26 HISTORY — DX: Retinal hemorrhage, bilateral: H35.63

## 2016-10-18 DIAGNOSIS — J449 Chronic obstructive pulmonary disease, unspecified: Secondary | ICD-10-CM

## 2016-10-18 HISTORY — DX: Chronic obstructive pulmonary disease, unspecified: J44.9

## 2016-10-30 DIAGNOSIS — M720 Palmar fascial fibromatosis [Dupuytren]: Secondary | ICD-10-CM | POA: Insufficient documentation

## 2016-10-30 DIAGNOSIS — M24542 Contracture, left hand: Secondary | ICD-10-CM

## 2016-10-30 HISTORY — DX: Palmar fascial fibromatosis (dupuytren): M72.0

## 2016-10-30 HISTORY — DX: Contracture, left hand: M24.542

## 2017-02-18 DIAGNOSIS — J449 Chronic obstructive pulmonary disease, unspecified: Secondary | ICD-10-CM | POA: Diagnosis not present

## 2017-02-18 DIAGNOSIS — J9600 Acute respiratory failure, unspecified whether with hypoxia or hypercapnia: Secondary | ICD-10-CM | POA: Diagnosis not present

## 2017-02-18 DIAGNOSIS — J441 Chronic obstructive pulmonary disease with (acute) exacerbation: Secondary | ICD-10-CM | POA: Diagnosis not present

## 2017-02-19 DIAGNOSIS — J9600 Acute respiratory failure, unspecified whether with hypoxia or hypercapnia: Secondary | ICD-10-CM | POA: Diagnosis not present

## 2017-02-19 DIAGNOSIS — J449 Chronic obstructive pulmonary disease, unspecified: Secondary | ICD-10-CM | POA: Diagnosis not present

## 2017-02-19 DIAGNOSIS — J441 Chronic obstructive pulmonary disease with (acute) exacerbation: Secondary | ICD-10-CM | POA: Diagnosis not present

## 2017-02-20 DIAGNOSIS — J449 Chronic obstructive pulmonary disease, unspecified: Secondary | ICD-10-CM | POA: Diagnosis not present

## 2017-02-20 DIAGNOSIS — J9600 Acute respiratory failure, unspecified whether with hypoxia or hypercapnia: Secondary | ICD-10-CM | POA: Diagnosis not present

## 2017-02-20 DIAGNOSIS — J441 Chronic obstructive pulmonary disease with (acute) exacerbation: Secondary | ICD-10-CM | POA: Diagnosis not present

## 2017-02-21 DIAGNOSIS — J449 Chronic obstructive pulmonary disease, unspecified: Secondary | ICD-10-CM | POA: Diagnosis not present

## 2017-02-21 DIAGNOSIS — J441 Chronic obstructive pulmonary disease with (acute) exacerbation: Secondary | ICD-10-CM | POA: Diagnosis not present

## 2017-02-21 DIAGNOSIS — J9600 Acute respiratory failure, unspecified whether with hypoxia or hypercapnia: Secondary | ICD-10-CM | POA: Diagnosis not present

## 2017-02-22 DIAGNOSIS — J9600 Acute respiratory failure, unspecified whether with hypoxia or hypercapnia: Secondary | ICD-10-CM | POA: Diagnosis not present

## 2017-02-22 DIAGNOSIS — J441 Chronic obstructive pulmonary disease with (acute) exacerbation: Secondary | ICD-10-CM | POA: Diagnosis not present

## 2017-02-22 DIAGNOSIS — J449 Chronic obstructive pulmonary disease, unspecified: Secondary | ICD-10-CM | POA: Diagnosis not present

## 2017-02-23 DIAGNOSIS — J9600 Acute respiratory failure, unspecified whether with hypoxia or hypercapnia: Secondary | ICD-10-CM | POA: Diagnosis not present

## 2017-02-23 DIAGNOSIS — J449 Chronic obstructive pulmonary disease, unspecified: Secondary | ICD-10-CM | POA: Diagnosis not present

## 2017-02-23 DIAGNOSIS — J441 Chronic obstructive pulmonary disease with (acute) exacerbation: Secondary | ICD-10-CM | POA: Diagnosis not present

## 2017-02-24 DIAGNOSIS — J441 Chronic obstructive pulmonary disease with (acute) exacerbation: Secondary | ICD-10-CM | POA: Diagnosis not present

## 2017-02-24 DIAGNOSIS — J9600 Acute respiratory failure, unspecified whether with hypoxia or hypercapnia: Secondary | ICD-10-CM | POA: Diagnosis not present

## 2017-02-24 DIAGNOSIS — J449 Chronic obstructive pulmonary disease, unspecified: Secondary | ICD-10-CM | POA: Diagnosis not present

## 2017-02-25 DIAGNOSIS — J441 Chronic obstructive pulmonary disease with (acute) exacerbation: Secondary | ICD-10-CM | POA: Diagnosis not present

## 2017-02-25 DIAGNOSIS — J9600 Acute respiratory failure, unspecified whether with hypoxia or hypercapnia: Secondary | ICD-10-CM | POA: Diagnosis not present

## 2017-02-25 DIAGNOSIS — J449 Chronic obstructive pulmonary disease, unspecified: Secondary | ICD-10-CM | POA: Diagnosis not present

## 2017-04-01 DIAGNOSIS — R609 Edema, unspecified: Secondary | ICD-10-CM | POA: Insufficient documentation

## 2017-04-01 HISTORY — DX: Edema, unspecified: R60.9

## 2017-04-18 ENCOUNTER — Telehealth: Payer: Self-pay | Admitting: Cardiology

## 2017-04-18 NOTE — Telephone Encounter (Signed)
Closed Encounter  °

## 2017-05-14 ENCOUNTER — Ambulatory Visit: Payer: Medicare Other | Admitting: Cardiology

## 2017-05-29 ENCOUNTER — Encounter: Payer: Self-pay | Admitting: Cardiology

## 2017-05-29 ENCOUNTER — Ambulatory Visit (INDEPENDENT_AMBULATORY_CARE_PROVIDER_SITE_OTHER): Payer: Medicare Other | Admitting: Cardiology

## 2017-05-29 VITALS — BP 152/86 | HR 76 | Ht 66.0 in | Wt 137.4 lb

## 2017-05-29 DIAGNOSIS — I110 Hypertensive heart disease with heart failure: Secondary | ICD-10-CM | POA: Diagnosis not present

## 2017-05-29 DIAGNOSIS — I482 Chronic atrial fibrillation, unspecified: Secondary | ICD-10-CM

## 2017-05-29 DIAGNOSIS — I5032 Chronic diastolic (congestive) heart failure: Secondary | ICD-10-CM

## 2017-05-29 DIAGNOSIS — Z950 Presence of cardiac pacemaker: Secondary | ICD-10-CM | POA: Diagnosis not present

## 2017-05-29 NOTE — Patient Instructions (Signed)
Medication Instructions:  Your physician has recommended you make the following change in your medication: CHANGE furosemide (Lasix) to twice weekly   Labwork: None  Testing/Procedures: You had an EKG today.  Follow-Up: Your physician wants you to follow-up in: 6 months. You will receive a reminder letter in the mail two months in advance. If you don't receive a letter, please call our office to schedule the follow-up appointment.  You will receive a phone call to schedule with Dr. Camnitz-electrophysiology.  Any Other Special Instructions Will Be Listed Below (If Applicable).     If you need a refill on your cardiac medications before your next appointment, please call your pharmacy.

## 2017-05-29 NOTE — Progress Notes (Signed)
Cardiology Office Note:    Date:  05/29/2017   ID:  Kimberly Raymond, DOB 28-Nov-1933, MRN [id number]  PCP:  Gordan Payment., MD  Cardiologist:  Norman Herrlich, MD    Referring MD: Gordan Payment., MD    ASSESSMENT:    1. Chronic atrial fibrillation (HCC)   2. Pacemaker   3. Chronic diastolic heart failure (HCC)   4. Hypertensive heart disease with heart failure (HCC)    PLAN:    In order of problems listed above:  1. Stable after AV nodal ablation and permanent pacemaker she has a good quality of life and desires to transition to EP and pacemaker clinic and [facility]  2. Stable function continued device check in our office 3. Mildly decompensated and asked her to resume her diuretic 2 days a week 4. Elevated BP should respond to diuretic recent labs reviewed all   Next appointment: 6 months   Medication Adjustments/Labs and Tests Ordered: Current medicines are reviewed at length with the patient today.  Concerns regarding medicines are outlined above.  Orders Placed This Encounter  Procedures  . EKG 12-Lead   No orders of the defined types were placed in this encounter.   Chief Complaint  Patient presents with  . Follow-up    Routine [place] appt  for atrial fibrillation and a pacemaker  . Shortness of Breath    History of Present Illness:    Kimberly Raymond is a 83 y.o. female with a hx of COPD, CHF,chronic Atrial Fibrillation  with AV nodal ablation for rate control with AVN ablation, Hypertension, non cardiac Chest Pain, and a pacemaker  last seen in April 2018. She is seeing Dr Logan Bores for a lower extremity venous ulcer.Also was [facility] to [facility] with pneumonia in April 2018.She had not been taking her diuretic recently. Compliance with diet, lifestyle and medications: Yes Past Medical History:  Diagnosis Date  . Age-related osteoporosis without current pathological fracture 05/18/2011   Last Assessment & Plan:  Relevant Hx: Course: Daily Update: Today's Plan:she is following with  Dr. Albertha Ghee for this and recieves prolia twice a year and is due for this in May  Electronically signed by: [name], NP 02/16/16 2101  . Allergic rhinitis   . Amblyopia, right eye 11/08/2015  . Arthritis 05/18/2011  . Branch retinal vein occlusion of left eye 11/08/2015  . Cardiac pacemaker in situ 05/18/2011  . Cerebral thrombosis with cerebral infarction (HCC) 05/18/2011  . Chronic atrial fibrillation (HCC) 11/22/2015   Overview:  With His ablation and permanent pacemaker for rate control, CHADS2 vasc score= 7  Overview:  Overview:  With His ablation and permanent pacemaker for rate control, CHADS2 vasc score= 7  Last Assessment & Plan:  Relevant Hx: Course: Daily Update: Today's Plan:she has pacemaker and is no longer taking eliquis   Electronically signed by: [name], NP 02/16/16 2059  . Chronic diastolic heart failure (HCC) 11/22/2015  . Chronic edema 04/01/2017  . Chronic obstructive pulmonary disease (HCC) 10/18/2016  . Contracture of finger joint, left 10/30/2016  . COPD (chronic obstructive pulmonary disease) (HCC)   . Costochondral chest pain 11/22/2015  . Dupuytren's contracture of left hand 10/30/2016  . Epiretinal membrane (ERM) of left eye 11/08/2015  . Essential hypertension 02/01/2016   Last Assessment & Plan:  Relevant Hx: Course: Daily Update: Today's Plan:this is fair for her right now and with her being sick it is not going to be ideal right now will follow  Electronically  signed by: [name], NP 02/16/16 2057  . Essential tremor 05/18/2011  . Hypertensive heart disease with heart failure (HCC) 11/22/2015  . Hypertensive retinopathy of both eyes 11/08/2015   Last Assessment & Plan:  Relevant Hx: Course: Daily Update: Today's Plan:she is following with specialist for this currently  Electronically signed by: [name], NP 02/16/16 2059  . Laryngopharyngeal reflux   . Left epiretinal membrane 06/26/2016  . Mixed  hyperlipidemia 02/01/2016   Last Assessment & Plan:  Relevant Hx: Course: Daily Update: Today's Plan:there is going to be fasting lipids done   Electronically signed by: [name], NP 02/16/16 2058  . Nocturnal oxygen desaturation   . Non-rheumatic mitral regurgitation 01/08/2016   Overview:  Mild  . Osteoporosis 05/18/2011  . Posterior vitreous detachment of both eyes 06/26/2016  . Presence of cardiac pacemaker 05/18/2011  . Pseudophakia of both eyes 11/08/2015  . PVD (posterior vitreous detachment), both eyes 11/08/2015  . Retinal hemorrhage of both eyes 06/26/2016  . S/P revision of total knee 08/10/2013  . S/P total knee arthroplasty 12/29/2013    Past Surgical History:  Procedure Laterality Date  . ABDOMINAL HYSTERECTOMY    . BACK SURGERY    . FEMUR FRACTURE SURGERY Right   . FINGER SURGERY Right    5 finger  . PACEMAKER INSERTION    . PR PART PALMAR FASCIEC,OPEN 1 DIGIT Left 10/30/2016     . REPLACEMENT TOTAL KNEE Right   . REVISION TOTAL KNEE ARTHROPLASTY Right   . SKIN GRAFT    . TONSILLECTOMY    . TUBAL LIGATION      Current Medications: Current Meds  Medication Sig  . albuterol (PROVENTIL HFA;VENTOLIN HFA) 108 (90 BASE) MCG/ACT inhaler Inhale 2 puffs into the lungs every 4 (four) hours as needed for wheezing or shortness of breath.  . Ascorbic Acid (VITAMIN C) 1000 MG tablet Take 1 tablet by mouth daily.  . Biotin 1 MG CAPS Take 1 capsule by mouth daily.  . Calcium Carb-Cholecalciferol (CALCIUM-VITAMIN D) 500-200 MG-UNIT tablet Take 1 tablet by mouth daily.  . Cholecalciferol (VITAMIN D3) 2000 units capsule Take 1 capsule by mouth daily.  . cyanocobalamin (,VITAMIN B-12,) 1000 MCG/ML injection Inject 1,000 mcg into the muscle every 30 (thirty) days.  Marland Kitchen denosumab (PROLIA) 60 MG/ML SOLN injection Inject 60 mg into the skin every 6 (six) months.  . DOCOSAHEXAENOIC ACID PO Take 1 g by mouth daily.  Marland Kitchen docusate sodium (COLACE) 100 MG capsule Take 50 mg by mouth  daily.  . furosemide (LASIX) 20 MG tablet Take 20 mg by mouth 2 (two) times a week. Takes on Mon, Wed, and Fri unless weight has increased by 3 lbs  . Garlic 1500 MG CAPS Take 1,500 mg by mouth daily.  . meclizine (ANTIVERT) 25 MG tablet Take 25 mg by mouth daily as needed for dizziness.  . Multiple Vitamin (MULTIVITAMIN) capsule Take 1 capsule by mouth daily.  . primidone (MYSOLINE) 250 MG tablet Take 1 tablet by mouth 2 (two) times daily.  . Probiotic Product (PROBIOTIC DAILY PO) Take 1 tablet by mouth daily.  . Red Yeast Rice Extract 600 MG CAPS Take 1 capsule by mouth daily.  Marland Kitchen umeclidinium-vilanterol (ANORO ELLIPTA) 62.5-25 MCG/INH AEPB Inhale 1 puff into the lungs daily.  . vitamin E 1000 UNIT capsule Take 1 capsule by mouth daily.     Allergies:   Lidocaine hcl; Procaine; Codeine; Epinephrine; Levofloxacin; and Latex   Social History   Social History  .  Marital status: Divorced    Spouse name: N/A  . Number of children: N/A  . Years of education: N/A   Social History Main Topics  . Smoking status: Former Smoker    Quit date: 10/22/1985  . Smokeless tobacco: Never Used  . Alcohol use No  . Drug use: No  . Sexual activity: Not Asked   Other Topics Concern  . None   Social History Narrative  . None     Family History: The patient's family history includes ALS in her mother; Stroke in her sister; Throat cancer in her sister; Ulcers in her father. ROS:   Please see the history of present illness.    All other systems reviewed and are negative.  EKGs/Labs/Other Studies Reviewed:    The following studies were reviewed today:  EKG:  EKG ordered today.  The ekg ordered today demonstrates Underlying atrial fibrillation RV lead paced rhythm normal capture  Recent Labs: No results found for requested labs within last 8760 hours.  Recent Lipid Panel No results found for: CHOL, TRIG, HDL, CHOLHDL, VLDL, LDLCALC, LDLDIRECT  Physical Exam:    VS:  BP (!) 152/86 (BP  Location: Right Arm, Patient Position: Sitting)   Pulse 76   Ht 5\' 6"  (1.676 m)   Wt 137 lb 6.4 oz (62.3 kg)   SpO2 95%   BMI 22.18 kg/m     Wt Readings from Last 3 Encounters:  05/29/17 137 lb 6.4 oz (62.3 kg)     GEN:  Well nourished, well developed in no acute distress HEENT: Normal NECK: No JVD; No carotid bruits LYMPHATICS: No lymphadenopathy CARDIAC: Variable first heart sound RRR, no murmurs, rubs, gallops RESPIRATORY:  Clear to auscultation without rales, wheezing or rhonchi  ABDOMEN: Soft, non-tender, non-distended MUSCULOSKELETAL:  No edema; No deformity  SKIN: Warm and dry NEUROLOGIC:  Alert and oriented x 3 PSYCHIATRIC:  Normal affect    Signed, Norman HerrlichBrian Detrice Cales, MD  05/29/2017 11:42 AM    [facility]

## 2017-05-30 DIAGNOSIS — Z79899 Other long term (current) drug therapy: Secondary | ICD-10-CM

## 2017-05-30 HISTORY — DX: Other long term (current) drug therapy: Z79.899

## 2017-06-05 ENCOUNTER — Encounter: Payer: Self-pay | Admitting: Internal Medicine

## 2017-06-27 ENCOUNTER — Encounter: Payer: Self-pay | Admitting: Internal Medicine

## 2017-06-27 ENCOUNTER — Ambulatory Visit (INDEPENDENT_AMBULATORY_CARE_PROVIDER_SITE_OTHER): Payer: Medicare Other | Admitting: Internal Medicine

## 2017-06-27 VITALS — BP 144/80 | HR 78 | Ht 66.0 in | Wt 140.0 lb

## 2017-06-27 DIAGNOSIS — I482 Chronic atrial fibrillation, unspecified: Secondary | ICD-10-CM

## 2017-06-27 DIAGNOSIS — I5032 Chronic diastolic (congestive) heart failure: Secondary | ICD-10-CM

## 2017-06-27 DIAGNOSIS — Z950 Presence of cardiac pacemaker: Secondary | ICD-10-CM | POA: Diagnosis not present

## 2017-06-27 LAB — CUP PACEART INCLINIC DEVICE CHECK
Battery Impedance: 874 Ohm
Battery Remaining Longevity: 66 mo
Battery Voltage: 2.78 V
Brady Statistic RV Percent Paced: 100 %
Date Time Interrogation Session: 20180906155052
Implantable Lead Implant Date: 20050802
Implantable Lead Location: 753860
Implantable Lead Model: 4076
Implantable Pulse Generator Implant Date: 20140717
Lead Channel Impedance Value: 0 Ohm
Lead Channel Impedance Value: 571 Ohm
Lead Channel Pacing Threshold Amplitude: 0.5 V
Lead Channel Pacing Threshold Pulse Width: 0.4 ms
Lead Channel Sensing Intrinsic Amplitude: 15.67 mV
Lead Channel Setting Pacing Amplitude: 2.5 V
Lead Channel Setting Pacing Pulse Width: 0.4 ms
Lead Channel Setting Sensing Sensitivity: 4 mV

## 2017-06-27 MED ORDER — APIXABAN 2.5 MG PO TABS: 2.5000 mg | ORAL_TABLET | Freq: Two times a day (BID) | ORAL | 0 refills | Status: DC

## 2017-06-27 MED ORDER — APIXABAN 2.5 MG PO TABS: 2.5000 mg | ORAL_TABLET | Freq: Two times a day (BID) | ORAL | 11 refills | Status: DC

## 2017-06-27 NOTE — Progress Notes (Signed)
ELECTROPHYSIOLOGY CONSULT NOTE  Patient ID: Kimberly Raymond, MRN: [id number], DOB/AGE: 81/30/1935 82 y.o. Admit date: (Not on file) Date of Consult: 06/27/2017  Primary Physician: Kimberly Raymond., MD Primary Cardiologist: BM     Kimberly Raymond is a 83 y.o. female who is being seen today for the evaluation o pacemaker and atrial fib at the request of BM.    HPI Kimberly Raymond is a 83 y.o. female seen to establish pacemaker care following AV junction ablation done initially in 2005. She underwent pacemaker generator replacement 2014.  She has modest exercise intolerance. She does not have nocturnal dyspnea or orthopnea. She has had no peripheral edema.  Thromboembolic risk factors are notable for prior TIA age, hypertension and gender for a CHADS-VASc score greater than or equal to 6.  She is not on anticoagulation.  She is unaware of coronary disease.  Spoke with Kimberly Raymond. Echocardiogram EF 2/17  55 %   Past Medical History:  Diagnosis Date  . Age-related osteoporosis without current pathological fracture 05/18/2011   Last Assessment & Plan:  Relevant Hx: Course: Daily Update: Today's Plan:she is following with Dr. Albertha Raymond for this and recieves prolia twice a year and is due for this in May  Electronically signed by: [name], NP 02/16/16 2101  . Allergic rhinitis   . Amblyopia, right eye 11/08/2015  . Arthritis 05/18/2011  . Branch retinal vein occlusion of left eye 11/08/2015  . Cardiac pacemaker in situ 05/18/2011  . Cerebral thrombosis with cerebral infarction (HCC) 05/18/2011  . Chronic atrial fibrillation (HCC) 11/22/2015   Overview:  With His ablation and permanent pacemaker for rate control, CHADS2 vasc score= 7  Overview:  Overview:  With His ablation and permanent pacemaker for rate control, CHADS2 vasc score= 7  Last Assessment & Plan:  Relevant Hx: Course: Daily Update: Today's Plan:she has pacemaker and is no longer taking eliquis    Electronically signed by: [name], NP 02/16/16 2059  . Chronic diastolic heart failure (HCC) 11/22/2015  . Chronic edema 04/01/2017  . Chronic obstructive pulmonary disease (HCC) 10/18/2016  . Contracture of finger joint, left 10/30/2016  . COPD (chronic obstructive pulmonary disease) (HCC)   . Costochondral chest pain 11/22/2015  . Dupuytren's contracture of left hand 10/30/2016  . Epiretinal membrane (ERM) of left eye 11/08/2015  . Essential hypertension 02/01/2016   Last Assessment & Plan:  Relevant Hx: Course: Daily Update: Today's Plan:this is fair for her right now and with her being sick it is not going to be ideal right now will follow  Electronically signed by: [name], NP 02/16/16 2057  . Essential tremor 05/18/2011  . Hypertensive heart disease with heart failure (HCC) 11/22/2015  . Hypertensive retinopathy of both eyes 11/08/2015   Last Assessment & Plan:  Relevant Hx: Course: Daily Update: Today's Plan:she is following with specialist for this currently  Electronically signed by: [name], NP 02/16/16 2059  . Laryngopharyngeal reflux   . Left epiretinal membrane 06/26/2016  . Mixed hyperlipidemia 02/01/2016   Last Assessment & Plan:  Relevant Hx: Course: Daily Update: Today's Plan:there is going to be fasting lipids done   Electronically signed by: [name], NP 02/16/16 2058  . Nocturnal oxygen desaturation   . Non-rheumatic mitral regurgitation 01/08/2016   Overview:  Mild  . Osteoporosis 05/18/2011  . Posterior vitreous detachment of both eyes 06/26/2016  . Presence of cardiac pacemaker 05/18/2011  . Pseudophakia of both  eyes 11/08/2015  . PVD (posterior vitreous detachment), both eyes 11/08/2015  . Retinal hemorrhage of both eyes 06/26/2016  . S/P revision of total knee 08/10/2013  . S/P total knee arthroplasty 12/29/2013      Surgical History:  Past Surgical History:  Procedure Laterality Date  . ABDOMINAL  HYSTERECTOMY    . BACK SURGERY    . FEMUR FRACTURE SURGERY Right   . FINGER SURGERY Right    5 finger  . PACEMAKER INSERTION    . PR PART PALMAR FASCIEC,OPEN 1 DIGIT Left 10/30/2016     . REPLACEMENT TOTAL KNEE Right   . REVISION TOTAL KNEE ARTHROPLASTY Right   . SKIN GRAFT    . TONSILLECTOMY    . TUBAL LIGATION       Home Meds: Prior to Admission medications   Medication Sig Start Date End Date Taking? Authorizing Provider  albuterol (PROVENTIL HFA;VENTOLIN HFA) 108 (90 BASE) MCG/ACT inhaler Inhale 2 puffs into the lungs every 4 (four) hours as needed for wheezing or shortness of breath.    [provider]  Ascorbic Acid (VITAMIN C) 1000 MG tablet Take 1 tablet by mouth daily.    [provider]  Biotin 1 MG CAPS Take 1 capsule by mouth daily.    [provider]  Calcium Carb-Cholecalciferol (CALCIUM-VITAMIN D) 500-200 MG-UNIT tablet Take 1 tablet by mouth daily.    [provider]  Cholecalciferol (VITAMIN D3) 2000 units capsule Take 1 capsule by mouth daily.    [provider]  cyanocobalamin (,VITAMIN B-12,) 1000 MCG/ML injection Inject 1,000 mcg into the muscle every 30 (thirty) days. 04/28/15   [provider]  denosumab (PROLIA) 60 MG/ML SOLN injection Inject 60 mg into the skin every 6 (six) months.    [provider]  DOCOSAHEXAENOIC ACID PO Take 1 g by mouth daily.    [provider]  docusate sodium (COLACE) 100 MG capsule Take 50 mg by mouth daily.    [provider]  furosemide (LASIX) 20 MG tablet Take 20 mg by mouth 2 (two) times a week. Takes on Mon, Wed, and Fri unless weight has increased by 3 lbs 07/16/16   [provider]  Garlic 1500 MG CAPS Take 1,500 mg by mouth daily.    [provider]  meclizine (ANTIVERT) 25 MG tablet Take 25 mg by mouth daily as needed for dizziness. 05/22/16   [provider]  Multiple Vitamin (MULTIVITAMIN) capsule Take 1 capsule by mouth  daily.    [provider]  primidone (MYSOLINE) 250 MG tablet Take 1 tablet by mouth 2 (two) times daily. 06/12/16   [provider]  Probiotic Product (PROBIOTIC DAILY PO) Take 1 tablet by mouth daily.    [provider]  Red Yeast Rice Extract 600 MG CAPS Take 1 capsule by mouth daily.    [provider]  umeclidinium-vilanterol (ANORO ELLIPTA) 62.5-25 MCG/INH AEPB Inhale 1 puff into the lungs daily.    [provider]  vitamin E 1000 UNIT capsule Take 1 capsule by mouth daily.    [provider]    Allergies:  Allergies  Allergen Reactions  . Lidocaine Hcl Shortness Of Breath  . Procaine Shortness Of Breath    "in a dentist office" pt reports  . Codeine     Hallucinations  . Epinephrine     Other reaction(s): Increased Heart Rate (intolerance)  . Levofloxacin     Other reaction(s): Malaise (intolerance)  . Latex Rash  Social History   Social History  . Marital status: Divorced    Spouse name: N/A  . Number of children: N/A  . Years of education: N/A   Occupational History  . Not on file.   Social History Main Topics  . Smoking status: Former Smoker    Quit date: 10/22/1985  . Smokeless tobacco: Never Used  . Alcohol use No  . Drug use: No  . Sexual activity: Not on file   Other Topics Concern  . Not on file   Social History Narrative  . No narrative on file     Family History  Problem Relation Age of Onset  . ALS Mother   . Ulcers Father   . Throat cancer Sister   . Stroke Sister      ROS:  Please see the history of present illness.     All other systems reviewed and negative.    Physical Exam: Blood pressure (!) 144/80, pulse 78, height 5\' 6"  (1.676 m), weight 140 lb (63.5 kg), SpO2 91 %. General: Well developed, well nourished female in no acute distress. Head: Normocephalic, atraumatic, sclera non-icteric, no xanthomas, nares are without discharge. EENT: normal  Lymph Nodes:  none Neck:  Negative for carotid bruits. JVD not elevated. Back:with mild kyphosis  Lungs: Clear bilaterally to auscultation without wheezes, rales, or rhonchi. Breathing is unlabored. Heart: RRR with S1 S2 1/6 late murmur . No rubs, or gallops appreciated. Abdomen: Soft, non-tender, non-distended with normoactive bowel sounds. No hepatomegaly. No rebound/guarding. No obvious abdominal masses. Msk:  Strength and tone appear normal for age. Extremities: No clubbing or cyanosis. No  edema.  Distal pedal pulses are 2+ and equal bilaterally. Skin: Warm and Dry Neuro: Alert and oriented X 3. CN III-XII intact Grossly normal sensory and motor function . Psych:  Responds to questions appropriately with a normal affect.      Labs: Cardiac Enzymes No results for input(s): CKTOTAL, CKMB, TROPONINI in the last 72 hours. CBC Lab Results  Component Value Date   WBC 8.7 08/31/2010   HGB 9.8 (L) 08/31/2010   HCT 28.1 (L) 08/31/2010   MCV 79.4 08/31/2010   PLT 156 08/31/2010   PROTIME: No results for input(s): LABPROT, INR in the last 72 hours. Chemistry No results for input(s): NA, K, CL, CO2, BUN, CREATININE, CALCIUM, PROT, BILITOT, ALKPHOS, ALT, AST, GLUCOSE in the last 168 hours.  Invalid input(s): LABALBU Lipids No results found for: CHOL, HDL, LDLCALC, TRIG BNP No results found for: PROBNP Thyroid Function Tests: No results for input(s): TSH, T4TOTAL, T3FREE, THYROIDAB in the last 72 hours.  Invalid input(s): FREET3 Miscellaneous No results found for: DDIMER  Radiology/Studies:  No results found.  EKG:  Atrial fibrillation with ventricular pacing at 78 with intervals-/18/44 -76   Assessment and Plan:   Atrial  Fib-permanent  AV ablation and complete heart block  HTN  The patient has atrial fibrillation that is permanent.  She has had interval evaluation of LV function however, we do not have access to these records. At this juncture I will presume that they are normal. She certainly  is at risk for pacemaker cardiomyopathy after all of these years.  She is also at high thromboembolic risk. She has been reluctant to take anticoagulation. We have reviewed the data from [facility] as well as from [facility]. She is willing to take ELIQUIS. Her weight at home is about 134 pounds. Hence, we will choose apixoban 2.5 mg twice daily. We will check her renal  function and hemoglobin today. We'll see her again in 3 months.  Sherryl Manges

## 2017-06-27 NOTE — Patient Instructions (Signed)
Medication Instructions: - Your physician has recommended you make the following change in your medication:  1) Start eliquis 2.5 mg- take 1 tablet by mouth TWICE daily  Labwork: - Your physician recommends that you have lab work today: BMP/ CBC  Procedures/Testing: -   Follow-Up: - Remote monitoring is used to monitor your Pacemaker of ICD from home. This monitoring reduces the number of office visits required to check your device to one time per year. It allows us to keep an eye on the functioning of your device to ensure it is working properly. You are scheduled for a device check from home on 09/26/17. You may send your transmission at any time that day. If you have a wireless device, the transmission will be sent automatically. After your physician reviews your transmission, you will receive a postcard with your next transmission date.  - Your physician wants you to follow-up in: July 2019 [facility] [facility] You will receive a reminder letter in the mail two months in advance. If you don't receive a letter, please call our office to schedule the follow-up appointment.   Any Additional Special Instructions Will Be Listed Below (If Applicable).     If you need a refill on your cardiac medications before your next appointment, please call your pharmacy.

## 2017-06-28 LAB — CBC WITH DIFFERENTIAL/PLATELET
Basophils Absolute: 0.1 10*3/uL (ref 0.0–0.2)
Basos: 1 %
EOS (ABSOLUTE): 0.5 10*3/uL — ABNORMAL HIGH (ref 0.0–0.4)
Eos: 6 %
Hematocrit: 34.9 % (ref 34.0–46.6)
Hemoglobin: 11.5 g/dL (ref 11.1–15.9)
Immature Grans (Abs): 0 10*3/uL (ref 0.0–0.1)
Immature Granulocytes: 0 %
Lymphocytes Absolute: 2.4 10*3/uL (ref 0.7–3.1)
Lymphs: 33 %
MCH: 27.2 pg (ref 26.6–33.0)
MCHC: 33 g/dL (ref 31.5–35.7)
MCV: 83 fL (ref 79–97)
Monocytes Absolute: 0.6 10*3/uL (ref 0.1–0.9)
Monocytes: 9 %
Neutrophils Absolute: 3.7 10*3/uL (ref 1.4–7.0)
Neutrophils: 51 %
Platelets: 248 10*3/uL (ref 150–379)
RBC: 4.23 x10E6/uL (ref 3.77–5.28)
RDW: 15.5 % — ABNORMAL HIGH (ref 12.3–15.4)
WBC: 7.2 10*3/uL (ref 3.4–10.8)

## 2017-06-28 LAB — BASIC METABOLIC PANEL
BUN/Creatinine Ratio: 16 (ref 12–28)
BUN: 8 mg/dL (ref 8–27)
CO2: 25 mmol/L (ref 20–29)
Calcium: 9.1 mg/dL (ref 8.7–10.3)
Chloride: 102 mmol/L (ref 96–106)
Creatinine, Ser: 0.49 mg/dL — ABNORMAL LOW (ref 0.57–1.00)
GFR calc Af Amer: 106 mL/min/{1.73_m2} (ref >59)
GFR calc non Af Amer: 92 mL/min/{1.73_m2} (ref >59)
Glucose: 79 mg/dL (ref 65–99)
Potassium: 4.3 mmol/L (ref 3.5–5.2)
Sodium: 141 mmol/L (ref 134–144)

## 2017-08-14 DIAGNOSIS — M549 Dorsalgia, unspecified: Secondary | ICD-10-CM

## 2017-08-14 DIAGNOSIS — G8929 Other chronic pain: Secondary | ICD-10-CM

## 2017-08-14 HISTORY — DX: Other chronic pain: G89.29

## 2017-08-15 DIAGNOSIS — R269 Unspecified abnormalities of gait and mobility: Secondary | ICD-10-CM

## 2017-08-15 DIAGNOSIS — R296 Repeated falls: Secondary | ICD-10-CM | POA: Insufficient documentation

## 2017-08-15 HISTORY — DX: Repeated falls: R29.6

## 2017-08-15 HISTORY — DX: Unspecified abnormalities of gait and mobility: R26.9

## 2017-09-26 ENCOUNTER — Ambulatory Visit (INDEPENDENT_AMBULATORY_CARE_PROVIDER_SITE_OTHER): Payer: Medicare Other | Admitting: *Deleted

## 2017-09-26 ENCOUNTER — Telehealth: Payer: Self-pay | Admitting: Cardiology

## 2017-09-26 DIAGNOSIS — I482 Chronic atrial fibrillation, unspecified: Secondary | ICD-10-CM

## 2017-09-26 NOTE — Progress Notes (Signed)
Remote pacemaker transmission.   

## 2017-09-26 NOTE — Telephone Encounter (Signed)
Spoke with pt and reminded pt of remote transmission that is due today. Pt verbalized understanding.   

## 2017-10-01 LAB — CUP PACEART REMOTE DEVICE CHECK
Battery Impedance: 1108 Ohm
Battery Remaining Longevity: 51 mo
Battery Voltage: 2.77 V
Brady Statistic RV Percent Paced: 100 %
Date Time Interrogation Session: 20181206191314
Implantable Lead Implant Date: 20050802
Implantable Lead Location: 753860
Implantable Lead Model: 4076
Implantable Pulse Generator Implant Date: 20140717
Lead Channel Impedance Value: 0 Ohm
Lead Channel Impedance Value: 536 Ohm
Lead Channel Pacing Threshold Amplitude: 0.5 V
Lead Channel Pacing Threshold Pulse Width: 0.4 ms
Lead Channel Setting Pacing Amplitude: 2.5 V
Lead Channel Setting Pacing Pulse Width: 0.4 ms
Lead Channel Setting Sensing Sensitivity: 4 mV

## 2017-10-04 ENCOUNTER — Encounter: Payer: Self-pay | Admitting: Cardiology

## 2017-10-10 ENCOUNTER — Other Ambulatory Visit: Payer: Self-pay | Admitting: Internal Medicine

## 2017-10-11 NOTE — Telephone Encounter (Signed)
[place] [facility] [place], should this be refilled? Please advise.

## 2017-12-24 ENCOUNTER — Ambulatory Visit: Payer: Medicare Other | Admitting: Cardiology

## 2017-12-26 ENCOUNTER — Ambulatory Visit: Payer: Medicare Other | Admitting: *Deleted

## 2017-12-26 ENCOUNTER — Telehealth: Payer: Self-pay | Admitting: Cardiology

## 2017-12-26 NOTE — Progress Notes (Signed)
Cardiology Office Note:    Date:  12/27/2017   ID:  Kimberly Jubaarol B Rondeau, DOB 09/29/1934, MRN [id number]  PCP:  Gordan PaymentGrisso, Greg A., MD  Cardiologist:  Norman HerrlichBrian Nyia Tsao, MD    Referring MD: Gordan PaymentGrisso, Greg A., MD    ASSESSMENT:    1. Chronic atrial fibrillation (HCC)   2. Cardiac pacemaker in situ   3. Costochondral chest pain   4. Rib pain on right side    PLAN:    In order of problems listed above:  1. Stable she has had AV nodal ablation and permanent pacemaker unaware of atrial fibrillation pleased with the quality of her life and I approached about repeating an echocardiogram and she does not wish to do it.  She will follow through with her device clinic.  Her last echocardiogram in February 2017 had normal left ventricular ejection fraction. 2. Stable she has been followed in our device clinic the skin over her pacemaker is quite thin there is no erosion and I asked her to put a soft Hankey underneath her bra strap and avoid carrying her purse on the left side. 3. Stable she is having some rib pain with old trauma tender over the ribs I offered to x-ray she declined 4. Musculoskeletal pain   Next appointment: 6 months   Medication Adjustments/Labs and Tests Ordered: Current medicines are reviewed at length with the patient today.  Concerns regarding medicines are outlined above.  No orders of the defined types were placed in this encounter.  No orders of the defined types were placed in this encounter.   Chief Complaint  Patient presents with  . Follow-up    6 month flup appt     History of Present Illness:    Kimberly Raymond is a 84 y.o. female with a hx of COPD, CHF,chronic Atrial Fibrillation  with AV nodal ablation for rate control with AVN ablation, Hypertension, non cardiac Chest Pain, and a pacemaker last seen 6 months ago. ASSESSMENT:   05/29/17   1. Chronic atrial fibrillation (HCC)   2. Pacemaker   3. Chronic diastolic heart failure (HCC)   4. Hypertensive heart  disease with heart failure (HCC)    PLAN:    1. Stable after AV nodal ablation and permanent pacemaker she has a good quality of life and desires to transition to EP and pacemaker clinic and [facility]  2. Stable function continued device check in our office 3. Mildly decompensated and asked her to resume her diuretic 2 days a week 4. Elevated BP should respond to diuretic recent labs reviewed all  Compliance with diet, lifestyle and medications: Yes, blood she again decided not to take an anticoagulant she is very intelligent woman she is not willing to accept the risk of bleeding but has agreed that if she were to have TIA or stroke she would rethink.  Her daughter was present for the discussion.  She never picked up her prescription for anticoagulant.  She continues to take low-dose aspirin.  She is limited with back pain but pleased with the quality of her life no chest pain shortness of breath palpitations syncope or TIA but complains bitterly of bruising with low-dose aspirin Past Medical History:  Diagnosis Date  . Age-related osteoporosis without current pathological fracture 05/18/2011   Last Assessment & Plan:  Relevant Hx: Course: Daily Update: Today's Plan:she is following with Dr. Albertha Gheeebecca Bassett for this and recieves prolia twice a year and is due for this in May  Electronically signed by:  [name], NP 02/16/16 2101  . Allergic rhinitis   . Amblyopia, right eye 11/08/2015  . Arthritis 05/18/2011  . Branch retinal vein occlusion of left eye 11/08/2015  . Cardiac pacemaker in situ 05/18/2011  . Cerebral thrombosis with cerebral infarction (HCC) 05/18/2011  . Chronic atrial fibrillation (HCC) 11/22/2015   Overview:  With His ablation and permanent pacemaker for rate control, CHADS2 vasc score= 7  Overview:  Overview:  With His ablation and permanent pacemaker for rate control, CHADS2 vasc score= 7  Last Assessment & Plan:  Relevant Hx: Course: Daily Update: Today's Plan:she has  pacemaker and is no longer taking eliquis   Electronically signed by: [name], NP 02/16/16 2059  . Chronic diastolic heart failure (HCC) 11/22/2015  . Chronic edema 04/01/2017  . Chronic obstructive pulmonary disease (HCC) 10/18/2016  . Contracture of finger joint, left 10/30/2016  . COPD (chronic obstructive pulmonary disease) (HCC)   . Costochondral chest pain 11/22/2015  . Dupuytren's contracture of left hand 10/30/2016  . Epiretinal membrane (ERM) of left eye 11/08/2015  . Essential hypertension 02/01/2016   Last Assessment & Plan:  Relevant Hx: Course: Daily Update: Today's Plan:this is fair for her right now and with her being sick it is not going to be ideal right now will follow  Electronically signed by: [name], NP 02/16/16 2057  . Essential tremor 05/18/2011  . Hypertensive heart disease with heart failure (HCC) 11/22/2015  . Hypertensive retinopathy of both eyes 11/08/2015   Last Assessment & Plan:  Relevant Hx: Course: Daily Update: Today's Plan:she is following with specialist for this currently  Electronically signed by: [name], NP 02/16/16 2059  . Laryngopharyngeal reflux   . Left epiretinal membrane 06/26/2016  . Mixed hyperlipidemia 02/01/2016   Last Assessment & Plan:  Relevant Hx: Course: Daily Update: Today's Plan:there is going to be fasting lipids done   Electronically signed by: [name], NP 02/16/16 2058  . Nocturnal oxygen desaturation   . Non-rheumatic mitral regurgitation 01/08/2016   Overview:  Mild  . Osteoporosis 05/18/2011  . Posterior vitreous detachment of both eyes 06/26/2016  . Presence of cardiac pacemaker 05/18/2011  . Pseudophakia of both eyes 11/08/2015  . PVD (posterior vitreous detachment), both eyes 11/08/2015  . Retinal hemorrhage of both eyes 06/26/2016  . S/P revision of total knee 08/10/2013  . S/P total knee arthroplasty 12/29/2013    Past Surgical History:  Procedure Laterality Date    . ABDOMINAL HYSTERECTOMY    . BACK SURGERY    . FEMUR FRACTURE SURGERY Right   . FINGER SURGERY Right    5 finger  . PACEMAKER INSERTION    . PR PART PALMAR FASCIEC,OPEN 1 DIGIT Left 10/30/2016     . REPLACEMENT TOTAL KNEE Right   . REVISION TOTAL KNEE ARTHROPLASTY Right   . SKIN GRAFT    . TONSILLECTOMY    . TUBAL LIGATION      Current Medications: Current Meds  Medication Sig  . albuterol (PROVENTIL HFA;VENTOLIN HFA) 108 (90 BASE) MCG/ACT inhaler Inhale 2 puffs into the lungs every 4 (four) hours as needed for wheezing or shortness of breath.  . Ascorbic Acid (VITAMIN C) 1000 MG tablet Take 1 tablet by mouth daily.  . Biotin 1 MG CAPS Take 1 capsule by mouth daily.  . Calcium Carb-Cholecalciferol (CALCIUM-VITAMIN D) 500-200 MG-UNIT tablet Take 1 tablet by mouth daily.  . Cholecalciferol (VITAMIN D3) 2000 units capsule Take 1 capsule by mouth daily.  Marland Kitchen  cyanocobalamin (,VITAMIN B-12,) 1000 MCG/ML injection Inject 1,000 mcg into the muscle every 30 (thirty) days.  Marland Kitchen denosumab (PROLIA) 60 MG/ML SOLN injection Inject 60 mg into the skin every 6 (six) months.  . DOCOSAHEXAENOIC ACID PO Take 1 g by mouth daily.  Marland Kitchen docusate sodium (COLACE) 100 MG capsule Take 50 mg by mouth daily.  . furosemide (LASIX) 20 MG tablet Take 20 mg by mouth 3 (three) times a week. Takes on Mon, Wed, and Fri unless weight has increased by 3 lbs  . Garlic 1500 MG CAPS Take 1,500 mg by mouth daily.  . meclizine (ANTIVERT) 25 MG tablet Take 25 mg by mouth daily as needed for dizziness.  . Multiple Vitamin (MULTIVITAMIN) capsule Take 1 capsule by mouth daily.  . primidone (MYSOLINE) 250 MG tablet Take 1 tablet by mouth as directed. Takes 1 tablet in the am and 1.5 tablets at night  . Probiotic Product (PROBIOTIC DAILY PO) Take 1 tablet by mouth daily.  . Red Yeast Rice Extract 600 MG CAPS Take 1 capsule by mouth daily.  Marland Kitchen umeclidinium-vilanterol (ANORO ELLIPTA) 62.5-25 MCG/INH AEPB Inhale 1 puff into the lungs daily.   . vitamin E 1000 UNIT capsule Take 1 capsule by mouth daily.     Allergies:   Lidocaine hcl; Procaine; Codeine; Epinephrine; Levofloxacin; and Latex   Social History   Socioeconomic History  . Marital status: Divorced    Spouse name: None  . Number of children: None  . Years of education: None  . Highest education level: None  Social Needs  . Financial resource strain: None  . Food insecurity - worry: None  . Food insecurity - inability: None  . Transportation needs - medical: None  . Transportation needs - non-medical: None  Occupational History  . None  Tobacco Use  . Smoking status: Former Smoker    Last attempt to quit: 10/22/1985    Years since quitting: 32.2  . Smokeless tobacco: Never Used  Substance and Sexual Activity  . Alcohol use: No  . Drug use: No  . Sexual activity: None  Other Topics Concern  . None  Social History Narrative  . None     Family History: The patient's family history includes ALS in her mother; Stroke in her sister; Throat cancer in her sister; Ulcers in her father. ROS:   Please see the history of present illness.    All other systems reviewed and are negative.  EKGs/Labs/Other Studies Reviewed:    The following studies were reviewed today:  Recent device check normal function  Recent Labs: CMP and CBC 08/15/17 arenormal 06/27/2017: BUN 8; Creatinine, Ser 0.49; Hemoglobin 11.5; Platelets 248; Potassium 4.3; Sodium 141  Recent Lipid Panel No results found for: CHOL, TRIG, HDL, CHOLHDL, VLDL, LDLCALC, LDLDIRECT  Physical Exam:    VS:  BP (!) 146/84 (BP Location: Right Arm, Patient Position: Sitting, Cuff Size: Normal)   Pulse 77   Ht 5\' 6"  (1.676 m)   Wt 138 lb 12.8 oz (63 kg)   SpO2 96%   BMI 22.40 kg/m     Wt Readings from Last 3 Encounters:  12/27/17 138 lb 12.8 oz (63 kg)  06/27/17 140 lb (63.5 kg)  05/29/17 137 lb 6.4 oz (62.3 kg)     GEN: She appears frail well nourished, well developed in no acute distress HEENT:  Normal NECK: No JVD; No carotid bruits LYMPHATICS: No lymphadenopathy CARDIAC: Variable first heart sound RRR, no murmurs, rubs, gallops RESPIRATORY:  Clear to auscultation  without rales, wheezing or rhonchi  Tender over posterior ribs on right ABDOMEN: Soft, non-tender, non-distended MUSCULOSKELETAL:  No edema; No deformity  SKIN: Warm and dry NEUROLOGIC:  Alert and oriented x 3 PSYCHIATRIC:  Normal affect    Signed, Norman Herrlich, MD  12/27/2017 10:54 AM    [facility]

## 2017-12-26 NOTE — Telephone Encounter (Signed)
LMOVM reminding pt to send remote transmission.   

## 2017-12-27 ENCOUNTER — Ambulatory Visit (INDEPENDENT_AMBULATORY_CARE_PROVIDER_SITE_OTHER): Payer: Medicare Other | Admitting: Cardiology

## 2017-12-27 ENCOUNTER — Encounter: Payer: Self-pay | Admitting: Cardiology

## 2017-12-27 VITALS — BP 146/84 | HR 77 | Ht 66.0 in | Wt 138.8 lb

## 2017-12-27 DIAGNOSIS — R0781 Pleurodynia: Secondary | ICD-10-CM | POA: Diagnosis not present

## 2017-12-27 DIAGNOSIS — R0789 Other chest pain: Secondary | ICD-10-CM

## 2017-12-27 DIAGNOSIS — I482 Chronic atrial fibrillation, unspecified: Secondary | ICD-10-CM

## 2017-12-27 DIAGNOSIS — R071 Chest pain on breathing: Secondary | ICD-10-CM | POA: Diagnosis not present

## 2017-12-27 DIAGNOSIS — Z950 Presence of cardiac pacemaker: Secondary | ICD-10-CM

## 2017-12-27 HISTORY — DX: Pleurodynia: R07.81

## 2017-12-27 NOTE — Progress Notes (Signed)
Not received  

## 2017-12-27 NOTE — Patient Instructions (Signed)

## 2017-12-30 DIAGNOSIS — R0602 Shortness of breath: Secondary | ICD-10-CM | POA: Insufficient documentation

## 2017-12-30 HISTORY — DX: Shortness of breath: R06.02

## 2018-01-06 ENCOUNTER — Ambulatory Visit (INDEPENDENT_AMBULATORY_CARE_PROVIDER_SITE_OTHER): Payer: Medicare Other | Admitting: *Deleted

## 2018-01-06 DIAGNOSIS — I482 Chronic atrial fibrillation, unspecified: Secondary | ICD-10-CM

## 2018-01-07 NOTE — Progress Notes (Signed)
Remote pacemaker transmission.   

## 2018-01-08 ENCOUNTER — Encounter: Payer: Self-pay | Admitting: Cardiology

## 2018-01-14 LAB — CUP PACEART REMOTE DEVICE CHECK
Battery Impedance: 1295 Ohm
Battery Remaining Longevity: 47 mo
Battery Voltage: 2.77 V
Brady Statistic RV Percent Paced: 100 %
Date Time Interrogation Session: 20190318201716
Implantable Lead Implant Date: 20050802
Implantable Lead Location: 753860
Implantable Lead Model: 4076
Implantable Pulse Generator Implant Date: 20140717
Lead Channel Impedance Value: 0 Ohm
Lead Channel Impedance Value: 542 Ohm
Lead Channel Pacing Threshold Amplitude: 0.5 V
Lead Channel Pacing Threshold Pulse Width: 0.4 ms
Lead Channel Setting Pacing Amplitude: 2.5 V
Lead Channel Setting Pacing Pulse Width: 0.4 ms
Lead Channel Setting Sensing Sensitivity: 4 mV

## 2018-02-17 DIAGNOSIS — I700 Atherosclerosis of aorta: Secondary | ICD-10-CM | POA: Insufficient documentation

## 2018-02-17 HISTORY — DX: Atherosclerosis of aorta: I70.0

## 2018-03-06 DIAGNOSIS — J9611 Chronic respiratory failure with hypoxia: Secondary | ICD-10-CM | POA: Insufficient documentation

## 2018-03-06 HISTORY — DX: Chronic respiratory failure with hypoxia: J96.11

## 2018-03-21 DIAGNOSIS — Z9981 Dependence on supplemental oxygen: Secondary | ICD-10-CM | POA: Insufficient documentation

## 2018-03-21 HISTORY — DX: Dependence on supplemental oxygen: Z99.81

## 2018-04-01 DIAGNOSIS — I2510 Atherosclerotic heart disease of native coronary artery without angina pectoris: Secondary | ICD-10-CM | POA: Insufficient documentation

## 2018-04-01 DIAGNOSIS — I25119 Atherosclerotic heart disease of native coronary artery with unspecified angina pectoris: Secondary | ICD-10-CM

## 2018-04-01 HISTORY — DX: Atherosclerotic heart disease of native coronary artery with unspecified angina pectoris: I25.119

## 2018-04-01 HISTORY — DX: Atherosclerotic heart disease of native coronary artery without angina pectoris: I25.10

## 2018-04-07 ENCOUNTER — Ambulatory Visit (INDEPENDENT_AMBULATORY_CARE_PROVIDER_SITE_OTHER): Payer: Medicare Other | Admitting: *Deleted

## 2018-04-07 ENCOUNTER — Telehealth: Payer: Self-pay | Admitting: Cardiology

## 2018-04-07 DIAGNOSIS — I482 Chronic atrial fibrillation, unspecified: Secondary | ICD-10-CM

## 2018-04-07 NOTE — Telephone Encounter (Signed)
LMOVM reminding pt to send remote transmission.   

## 2018-04-08 ENCOUNTER — Encounter: Payer: Self-pay | Admitting: Cardiology

## 2018-04-08 NOTE — Progress Notes (Signed)
Remote pacemaker transmission.   

## 2018-04-09 LAB — CUP PACEART REMOTE DEVICE CHECK
Battery Impedance: 1430 Ohm
Battery Remaining Longevity: 43 mo
Battery Voltage: 2.76 V
Brady Statistic RV Percent Paced: 100 %
Date Time Interrogation Session: 20190617195522
Implantable Lead Implant Date: 20050802
Implantable Lead Location: 753860
Implantable Lead Model: 4076
Implantable Pulse Generator Implant Date: 20140717
Lead Channel Impedance Value: 0 Ohm
Lead Channel Impedance Value: 528 Ohm
Lead Channel Pacing Threshold Amplitude: 0.5 V
Lead Channel Pacing Threshold Pulse Width: 0.4 ms
Lead Channel Setting Pacing Amplitude: 2.5 V
Lead Channel Setting Pacing Pulse Width: 0.4 ms
Lead Channel Setting Sensing Sensitivity: 4 mV

## 2018-04-10 DIAGNOSIS — L89899 Pressure ulcer of other site, unspecified stage: Secondary | ICD-10-CM

## 2018-04-10 HISTORY — DX: Pressure ulcer of other site, unspecified stage: L89.899

## 2018-04-15 DIAGNOSIS — D513 Other dietary vitamin B12 deficiency anemia: Secondary | ICD-10-CM | POA: Insufficient documentation

## 2018-04-15 HISTORY — DX: Other dietary vitamin B12 deficiency anemia: D51.3

## 2018-05-03 NOTE — Progress Notes (Signed)
Cardiology Office Note:    Date:  05/05/2018   ID:  Kimberly Raymond, DOB [birth year], MRN [id number]  PCP:  Gordan Payment., MD  Cardiologist:  Norman Herrlich, MD    Referring MD: Gordan Payment., MD    ASSESSMENT:    1. Chest pain in adult   2. Chronic atrial fibrillation (HCC)   3. Cardiac pacemaker in situ   4. Chronic obstructive pulmonary disease, unspecified COPD type (HCC)    PLAN:    In order of problems listed above:  1. We are present presentation was typical of pleuritis quickly resolved no recurrence at this time I perform no further cardiac diagnostic testing. 2. Stable asymptomatic after AV nodal ablation and permanent pacemaker.  She is not anticoagulated at her choice I did ask her to start taking coated aspirin 81 mg daily 3. Stable continue following our device clinic normal function 4. Severe she obviously has become increasingly frail and limited by her underlying lung disease    Next appointment: 6 months   Medication Adjustments/Labs and Tests Ordered: Current medicines are reviewed at length with the patient today.  Concerns regarding medicines are outlined above.  No orders of the defined types were placed in this encounter.  Meds ordered this encounter  Medications  . aspirin EC 81 MG tablet    Sig: Take 1 tablet (81 mg total) by mouth daily.    Dispense:  30 tablet    Refill:  3    No chief complaint on file.   History of Present Illness:    Kimberly Raymond is a 84 y.o. female with a hx of COPD atrial fibrillation permanent pacemaker last seen 12/27/2017.  She was recently seen at [facility] ED 03/24/2018 with nonanginal localized rib persistent chest pain worse with a deep breath.  Her hemoglobin is mildly diminished 11.7 BMP was normal troponin initial and repeat were both nondetectable.  Chest x-ray showed findings of COPD and unchanged small right pleural effusion CTA of the chest showed coronary artery calcification and no indication  pulmonary thromboembolism Compliance with diet, lifestyle and medications: yes  No chest pain was very self-limited brief no recurrence.  She is increasingly frail losing weight weak short of breath even with ADLs no edema orthopnea syncope or TIA. Past Medical History:  Diagnosis Date  . Age-related osteoporosis without current pathological fracture 05/18/2011   Last Assessment & Plan:  Relevant Hx: Course: Daily Update: Today's Plan:she is following with Dr. Albertha Ghee for this and recieves prolia twice a year and is due for this in May  Electronically signed by: [name], NP 02/16/16 2101  . Allergic rhinitis   . Amblyopia, right eye 11/08/2015  . Arthritis 05/18/2011  . Branch retinal vein occlusion of left eye 11/08/2015  . Cardiac pacemaker in situ 05/18/2011  . Cerebral thrombosis with cerebral infarction (HCC) 05/18/2011  . Chronic atrial fibrillation (HCC) 11/22/2015   Overview:  With His ablation and permanent pacemaker for rate control, CHADS2 vasc score= 7  Overview:  Overview:  With His ablation and permanent pacemaker for rate control, CHADS2 vasc score= 7  Last Assessment & Plan:  Relevant Hx: Course: Daily Update: Today's Plan:she has pacemaker and is no longer taking eliquis   Electronically signed by: [name], NP 02/16/16 2059  . Chronic diastolic heart failure (HCC) 11/22/2015  . Chronic edema 04/01/2017  . Chronic obstructive pulmonary disease (HCC) 10/18/2016  . Contracture of finger joint, left 10/30/2016  . COPD (  chronic obstructive pulmonary disease) (HCC)   . Costochondral chest pain 11/22/2015  . Dupuytren's contracture of left hand 10/30/2016  . Epiretinal membrane (ERM) of left eye 11/08/2015  . Essential hypertension 02/01/2016   Last Assessment & Plan:  Relevant Hx: Course: Daily Update: Today's Plan:this is fair for her right now and with her being sick it is not going to be ideal right now will follow  Electronically signed by:  Krystal ClarkMelissa Joyce Brown-Patram, NP 02/16/16 2057  . Essential tremor 05/18/2011  . Hypertensive heart disease with heart failure (HCC) 11/22/2015  . Hypertensive retinopathy of both eyes 11/08/2015   Last Assessment & Plan:  Relevant Hx: Course: Daily Update: Today's Plan:she is following with specialist for this currently  Electronically signed by: Krystal ClarkMelissa Joyce Brown-Patram, NP 02/16/16 2059  . Laryngopharyngeal reflux   . Left epiretinal membrane 06/26/2016  . Mixed hyperlipidemia 02/01/2016   Last Assessment & Plan:  Relevant Hx: Course: Daily Update: Today's Plan:there is going to be fasting lipids done   Electronically signed by: Krystal ClarkMelissa Joyce Brown-Patram, NP 02/16/16 2058  . Nocturnal oxygen desaturation   . Non-rheumatic mitral regurgitation 01/08/2016   Overview:  Mild  . Osteoporosis 05/18/2011  . Posterior vitreous detachment of both eyes 06/26/2016  . Presence of cardiac pacemaker 05/18/2011  . Pseudophakia of both eyes 11/08/2015  . PVD (posterior vitreous detachment), both eyes 11/08/2015  . Retinal hemorrhage of both eyes 06/26/2016  . S/P revision of total knee 08/10/2013  . S/P total knee arthroplasty 12/29/2013    Past Surgical History:  Procedure Laterality Date  . ABDOMINAL HYSTERECTOMY    . BACK SURGERY    . FEMUR FRACTURE SURGERY Right   . FINGER SURGERY Right    5 finger  . PACEMAKER INSERTION    . PR PART PALMAR FASCIEC,OPEN 1 DIGIT Left 10/30/2016     . REPLACEMENT TOTAL KNEE Right   . REVISION TOTAL KNEE ARTHROPLASTY Right   . SKIN GRAFT    . TONSILLECTOMY    . TUBAL LIGATION      Current Medications: Current Meds  Medication Sig  . albuterol (PROVENTIL HFA;VENTOLIN HFA) 108 (90 BASE) MCG/ACT inhaler Inhale 2 puffs into the lungs every 4 (four) hours as needed for wheezing or shortness of breath.  . Ascorbic Acid (VITAMIN C) 1000 MG tablet Take 1 tablet by mouth daily.  . Biotin 1 MG CAPS Take 1 capsule by mouth daily.  . Calcium Carb-Cholecalciferol (CALCIUM-VITAMIN  D) 500-200 MG-UNIT tablet Take 1 tablet by mouth daily.  . Cholecalciferol (VITAMIN D3) 2000 units capsule Take 1 capsule by mouth daily.  . cyanocobalamin (,VITAMIN B-12,) 1000 MCG/ML injection Inject 1,000 mcg into the muscle every 30 (thirty) days.  Marland Kitchen. denosumab (PROLIA) 60 MG/ML SOLN injection Inject 60 mg into the skin every 6 (six) months.  . docusate sodium (COLACE) 100 MG capsule Take 50 mg by mouth daily.  . furosemide (LASIX) 20 MG tablet Take 20 mg by mouth 3 (three) times a week. Takes on Mon, Wed, and Fri unless weight has increased by 3 lbs  . Garlic 1500 MG CAPS Take 1,500 mg by mouth daily.  . meclizine (ANTIVERT) 25 MG tablet Take 25 mg by mouth daily as needed for dizziness.  . Multiple Vitamin (MULTIVITAMIN) capsule Take 1 capsule by mouth daily.  . primidone (MYSOLINE) 250 MG tablet Take 1 tablet by mouth as directed. Takes 1 tablet in the am and 1.5 tablets at night  . Probiotic Product (PROBIOTIC DAILY PO) Take 1  tablet by mouth daily.  . Red Yeast Rice Extract 600 MG CAPS Take 1 capsule by mouth daily.  Marland Kitchen umeclidinium-vilanterol (ANORO ELLIPTA) 62.5-25 MCG/INH AEPB Inhale 1 puff into the lungs daily.  . vitamin E 1000 UNIT capsule Take 1 capsule by mouth daily.     Allergies:   Lidocaine hcl; Procaine; Codeine; Epinephrine; Levofloxacin; and Latex   Social History   Socioeconomic History  . Marital status: Divorced    Spouse name: Not on file  . Number of children: Not on file  . Years of education: Not on file  . Highest education level: Not on file  Occupational History  . Not on file  Social Needs  . Financial resource strain: Not on file  . Food insecurity:    Worry: Not on file    Inability: Not on file  . Transportation needs:    Medical: Not on file    Non-medical: Not on file  Tobacco Use  . Smoking status: Former Smoker    Last attempt to quit: 10/22/1985    Years since quitting: 32.5  . Smokeless tobacco: Never Used  Substance and Sexual Activity   . Alcohol use: No  . Drug use: No  . Sexual activity: Not on file  Lifestyle  . Physical activity:    Days per week: Not on file    Minutes per session: Not on file  . Stress: Not on file  Relationships  . Social connections:    Talks on phone: Not on file    Gets together: Not on file    Attends religious service: Not on file    Active member of club or organization: Not on file    Attends meetings of clubs or organizations: Not on file    Relationship status: Not on file  Other Topics Concern  . Not on file  Social History Narrative  . Not on file     Family History: The patient's family history includes ALS in her mother; Stroke in her sister; Throat cancer in her sister; Ulcers in her father. ROS:   Please see the history of present illness.    All other systems reviewed and are negative.  EKGs/Labs/Other Studies Reviewed:    The following studies were reviewed today:    Recent Labs: 06/27/2017: BUN 8; Creatinine, Ser 0.49; Hemoglobin 11.5; Platelets 248; Potassium 4.3; Sodium 141  Recent Lipid Panel No results found for: CHOL, TRIG, HDL, CHOLHDL, VLDL, LDLCALC, LDLDIRECT  Physical Exam:    VS:  BP 122/64 (BP Location: Right Arm, Patient Position: Sitting, Cuff Size: Normal)   Pulse 80   Ht 5\' 6"  (1.676 m)   Wt 131 lb (59.4 kg)   SpO2 94%   BMI 21.14 kg/m     Wt Readings from Last 3 Encounters:  05/05/18 131 lb (59.4 kg)  12/27/17 138 lb 12.8 oz (63 kg)  06/27/17 140 lb (63.5 kg)     GEN: Very frail chronically ill COPD appearance  HEENT: Normal NECK: No JVD; No carotid bruits LYMPHATICS: No lymphadenopathy CARDIAC: Distant heart sounds variable for S1 RRR,  RESPIRATORY:  Clear to auscultation without rales, wheezing or rhonchi  ABDOMEN: Soft, non-tender, non-distended MUSCULOSKELETAL:  No edema; No deformity  SKIN: Warm and dry NEUROLOGIC:  Alert and oriented x 3 PSYCHIATRIC:  Normal affect    Signed, Norman Herrlich, MD  05/05/2018 5:11 PM      [facility]

## 2018-05-05 ENCOUNTER — Encounter: Payer: Self-pay | Admitting: Cardiology

## 2018-05-05 ENCOUNTER — Ambulatory Visit (INDEPENDENT_AMBULATORY_CARE_PROVIDER_SITE_OTHER): Payer: Medicare Other | Admitting: Cardiology

## 2018-05-05 VITALS — BP 122/64 | HR 80 | Ht 66.0 in | Wt 131.0 lb

## 2018-05-05 DIAGNOSIS — Z950 Presence of cardiac pacemaker: Secondary | ICD-10-CM | POA: Diagnosis not present

## 2018-05-05 DIAGNOSIS — J449 Chronic obstructive pulmonary disease, unspecified: Secondary | ICD-10-CM | POA: Diagnosis not present

## 2018-05-05 DIAGNOSIS — I482 Chronic atrial fibrillation, unspecified: Secondary | ICD-10-CM

## 2018-05-05 DIAGNOSIS — R079 Chest pain, unspecified: Secondary | ICD-10-CM | POA: Diagnosis not present

## 2018-05-05 MED ORDER — ASPIRIN EC 81 MG PO TBEC: 81.0000 mg | DELAYED_RELEASE_TABLET | Freq: Every day | ORAL | 3 refills | Status: DC

## 2018-05-05 NOTE — Patient Instructions (Addendum)
Medication Instructions:  Your physician has recommended you make the following change in your medication:   START: Aspirin enteric coated  81mg  take one tablet daily     Labwork: NONE  Testing/Procedures: NONE  Follow-Up: Your physician wants you to follow-up in: 6 months. You will receive a reminder letter in the mail two months in advance. If you don't receive a letter, please call our office to schedule the follow-up appointment.   Any Other Special Instructions Will Be Listed Below (If Applicable).     If you need a refill on your cardiac medications before your next appointment, please call your pharmacy.

## 2018-05-22 DIAGNOSIS — J324 Chronic pansinusitis: Secondary | ICD-10-CM

## 2018-05-22 DIAGNOSIS — E611 Iron deficiency: Secondary | ICD-10-CM

## 2018-05-22 DIAGNOSIS — J310 Chronic rhinitis: Secondary | ICD-10-CM

## 2018-05-22 HISTORY — DX: Chronic rhinitis: J31.0

## 2018-05-22 HISTORY — DX: Iron deficiency: E61.1

## 2018-05-22 HISTORY — DX: Chronic pansinusitis: J32.4

## 2018-06-04 ENCOUNTER — Ambulatory Visit (INDEPENDENT_AMBULATORY_CARE_PROVIDER_SITE_OTHER): Payer: Medicare Other | Admitting: Sports Medicine

## 2018-06-04 ENCOUNTER — Other Ambulatory Visit: Payer: Self-pay

## 2018-06-04 ENCOUNTER — Other Ambulatory Visit: Payer: Self-pay | Admitting: Sports Medicine

## 2018-06-04 ENCOUNTER — Encounter: Payer: Self-pay | Admitting: Sports Medicine

## 2018-06-04 ENCOUNTER — Ambulatory Visit (INDEPENDENT_AMBULATORY_CARE_PROVIDER_SITE_OTHER): Payer: Medicare Other

## 2018-06-04 DIAGNOSIS — I739 Peripheral vascular disease, unspecified: Secondary | ICD-10-CM | POA: Diagnosis not present

## 2018-06-04 DIAGNOSIS — M7751 Other enthesopathy of right foot: Secondary | ICD-10-CM | POA: Diagnosis not present

## 2018-06-04 DIAGNOSIS — S90121A Contusion of right lesser toe(s) without damage to nail, initial encounter: Secondary | ICD-10-CM

## 2018-06-04 DIAGNOSIS — M792 Neuralgia and neuritis, unspecified: Secondary | ICD-10-CM

## 2018-06-04 DIAGNOSIS — M79671 Pain in right foot: Secondary | ICD-10-CM

## 2018-06-04 NOTE — Progress Notes (Signed)
Subjective: Kimberly Raymond is a 84 y.o. female patient who presents to office for evaluation of right plantar and lateral foot pain. Patient complains of progressive pain especially over the last 2 months in the right foot. Ranks pain 4/10 and is now interferring with daily activities and especially with sleeping on her side. Patient has tried icing with no relief in symptoms. Patient denies any other pedal complaints. Denies injury/trip/fall/sprain/any causative factors.   Review of Systems  Musculoskeletal: Positive for back pain, joint pain and myalgias.  All other systems reviewed and are negative.    Patient Active Problem List   Diagnosis Date Noted  . Other dietary vitamin B12 deficiency anemia 04/15/2018  . Pressure injury of skin of dorsum of right foot 04/10/2018  . Coronary artery disease involving native coronary artery of native heart with angina pectoris (HCC) 04/01/2018  . Supplemental oxygen dependent 03/21/2018  . Chronic respiratory failure with hypoxia (HCC) 03/06/2018  . Aortic atherosclerosis (HCC) 02/17/2018  . Shortness of breath 12/30/2017  . Rib pain on right side 12/27/2017  . Gait abnormality 08/15/2017  . Multiple falls 08/15/2017  . Chronic bilateral back pain 08/14/2017  . High risk medication use 05/30/2017  . Chronic edema 04/01/2017  . Contracture of finger joint, left 10/30/2016  . Dupuytren's contracture of left hand 10/30/2016  . Chronic obstructive pulmonary disease (HCC) 10/18/2016  . Left epiretinal membrane 06/26/2016  . Posterior vitreous detachment of both eyes 06/26/2016  . Retinal hemorrhage of both eyes 06/26/2016  . Dizziness 05/22/2016  . Mixed hyperlipidemia 02/01/2016  . Non-rheumatic mitral regurgitation 01/08/2016  . Chronic atrial fibrillation (HCC) 11/22/2015  . Chronic diastolic heart failure (HCC) 11/22/2015  . Costochondral chest pain 11/22/2015  . Hypertensive heart disease with heart failure (HCC) 11/22/2015  . Amblyopia,  right eye 11/08/2015  . Branch retinal vein occlusion of left eye 11/08/2015  . Epiretinal membrane (ERM) of left eye 11/08/2015  . Hypertensive retinopathy of both eyes 11/08/2015  . Pseudophakia of both eyes 11/08/2015  . PVD (posterior vitreous detachment), both eyes 11/08/2015  . S/P total knee arthroplasty 12/29/2013  . S/P revision of total knee 08/10/2013  . Age-related osteoporosis without current pathological fracture 05/18/2011  . Arthritis 05/18/2011  . Cardiac pacemaker in situ 05/18/2011  . Cerebral thrombosis with cerebral infarction (HCC) 05/18/2011  . Essential tremor 05/18/2011  . Osteoporosis 05/18/2011  . Pacemaker 05/18/2011    Current Outpatient Medications on File Prior to Visit  Medication Sig Dispense Refill  . albuterol (PROVENTIL HFA;VENTOLIN HFA) 108 (90 BASE) MCG/ACT inhaler Inhale 2 puffs into the lungs every 4 (four) hours as needed for wheezing or shortness of breath.    . Ascorbic Acid (VITAMIN C) 1000 MG tablet Take 1 tablet by mouth daily.    . Biotin 1 MG CAPS Take 1 capsule by mouth daily.    . Calcium Carb-Cholecalciferol (CALCIUM-VITAMIN D) 500-200 MG-UNIT tablet Take 1 tablet by mouth daily.    . Cholecalciferol (VITAMIN D3) 2000 units capsule Take 1 capsule by mouth daily.    . cyanocobalamin (,VITAMIN B-12,) 1000 MCG/ML injection Inject 1,000 mcg into the muscle every 30 (thirty) days.    Marland Kitchen. denosumab (PROLIA) 60 MG/ML SOLN injection Inject 60 mg into the skin every 6 (six) months.    . furosemide (LASIX) 20 MG tablet Take 20 mg by mouth 3 (three) times a week. Takes on Mon, Wed, and Fri unless weight has increased by 3 lbs    . Garlic 1500 MG CAPS  Take 1,500 mg by mouth daily.    . meclizine (ANTIVERT) 25 MG tablet Take 25 mg by mouth daily as needed for dizziness.    . Multiple Vitamin (MULTIVITAMIN) capsule Take 1 capsule by mouth daily.    . primidone (MYSOLINE) 250 MG tablet Take 1 tablet by mouth as directed. Takes 1 tablet in the am and 1.5  tablets at night    . Probiotic Product (PROBIOTIC DAILY PO) Take 1 tablet by mouth daily.    . Red Yeast Rice Extract 600 MG CAPS Take 1 capsule by mouth daily.    . vitamin E 1000 UNIT capsule Take 1 capsule by mouth daily.     No current facility-administered medications on file prior to visit.     Allergies  Allergen Reactions  . Lidocaine Hcl Shortness Of Breath  . Procaine Shortness Of Breath    "in a dentist office" pt reports  . Codeine     Hallucinations  . Epinephrine     Other reaction(s): Increased Heart Rate (intolerance)  . Levofloxacin     Other reaction(s): Malaise (intolerance)  . Latex Rash    Objective:  General: Alert and oriented x3 in no acute distress  Dermatology: No open lesions bilateral lower extremities, no webspace macerations, no ecchymosis bilateral, all nails x 10 are well manicured.  Bruise to right fifth toe.  Vascular: Dorsalis Pedis and Posterior Tibial pedal pulses nonpalpable, Capillary Fill Time 5 seconds, no pedal hair growth bilateral, trace edema bilateral lower extremities, Temperature gradient decreased bilateral with significant varicosities and purple discoloration to both feet.  Neurology: Gross sensation intact via light touch bilateral, protective sensation diminished right greater than left with bunched up sock feeling.  Musculoskeletal: Mild tenderness with palpation at right plantar forefoot and prominent styloid process, there is also mild ecchymosis to right fifth toe without pain, strength within normal limits in all groups bilateral.   Gait: Antalgic gait  Xrays  Right foot   Impression: Decreased osseous mineralization there is mild areas of joint space narrowing and arthritis diffusely across the right foot otherwise hammertoe deformity at toes and no other acute findings.  Assessment and Plan: Problem List Items Addressed This Visit    None    Visit Diagnoses    Right foot pain    -  Primary   Relevant Orders    DG Foot Complete Right   Neuritis       Bursitis of right foot       PVD (peripheral vascular disease) (HCC)       Contusion of toe of right foot, unspecified toe, initial encounter           -Complete examination performed -Xrays reviewed -Discussed treatement options for right foot pain -Rx nerve conduction studies to further evaluate potential causes of neuritis -Recommend postop shoe to offload fifth metatarsal base styloid process at area where there is pain and likely bursitis of the foot since patient declines steroid injection however patient decided that she did not want to pay the cost of a postop shoe and took offloading padding instead -Patient to return to office after nerve conduction velocity testing or sooner if condition worsens.  Asencion Islamitorya Abrish Erny, DPM

## 2018-06-04 NOTE — Progress Notes (Signed)
   Subjective:    Patient ID: Kimberly Raymond, female    DOB: 08/07/1934, 84 y.o.   MRN: [id number]  HPI    Review of Systems  Musculoskeletal: Positive for arthralgias and myalgias.  All other systems reviewed and are negative.      Objective:   Physical Exam        Assessment & Plan:

## 2018-06-06 ENCOUNTER — Other Ambulatory Visit: Payer: Self-pay

## 2018-06-06 DIAGNOSIS — M7751 Other enthesopathy of right foot: Secondary | ICD-10-CM

## 2018-06-06 DIAGNOSIS — M792 Neuralgia and neuritis, unspecified: Secondary | ICD-10-CM

## 2018-06-19 ENCOUNTER — Encounter: Payer: Medicare Other | Admitting: Internal Medicine

## 2018-07-07 ENCOUNTER — Encounter: Payer: Medicare Other | Admitting: *Deleted

## 2018-07-07 ENCOUNTER — Telehealth: Payer: Self-pay | Admitting: Cardiology

## 2018-07-07 NOTE — Telephone Encounter (Signed)
LMOVM reminding pt to send remote transmission.   

## 2018-07-08 ENCOUNTER — Encounter: Payer: Self-pay | Admitting: Cardiology

## 2018-07-08 NOTE — Progress Notes (Signed)
Letter  

## 2018-07-15 ENCOUNTER — Ambulatory Visit (INDEPENDENT_AMBULATORY_CARE_PROVIDER_SITE_OTHER): Payer: Medicare Other | Admitting: *Deleted

## 2018-07-15 DIAGNOSIS — I482 Chronic atrial fibrillation, unspecified: Secondary | ICD-10-CM

## 2018-07-15 NOTE — Progress Notes (Signed)
Remote pacemaker transmission.   

## 2018-07-16 ENCOUNTER — Encounter: Payer: Self-pay | Admitting: Cardiology

## 2018-07-23 LAB — CUP PACEART REMOTE DEVICE CHECK
Battery Impedance: 1682 Ohm
Battery Remaining Longevity: 39 mo
Battery Voltage: 2.76 V
Brady Statistic RV Percent Paced: 100 %
Date Time Interrogation Session: 20190924140150
Implantable Lead Implant Date: 20050802
Implantable Lead Location: 753860
Implantable Lead Model: 4076
Implantable Pulse Generator Implant Date: 20140717
Lead Channel Impedance Value: 0 Ohm
Lead Channel Impedance Value: 554 Ohm
Lead Channel Pacing Threshold Amplitude: 0.5 V
Lead Channel Pacing Threshold Pulse Width: 0.4 ms
Lead Channel Setting Pacing Amplitude: 2.5 V
Lead Channel Setting Pacing Pulse Width: 0.4 ms
Lead Channel Setting Sensing Sensitivity: 4 mV

## 2018-08-12 ENCOUNTER — Encounter: Payer: Medicare Other | Admitting: Internal Medicine

## 2018-08-14 ENCOUNTER — Encounter: Payer: Medicare Other | Admitting: Internal Medicine

## 2018-08-26 ENCOUNTER — Ambulatory Visit: Payer: Medicare Other | Admitting: Cardiology
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
## 2018-08-27 ENCOUNTER — Encounter: Payer: Self-pay | Admitting: Cardiology

## 2018-08-27 ENCOUNTER — Ambulatory Visit (INDEPENDENT_AMBULATORY_CARE_PROVIDER_SITE_OTHER): Payer: Medicare Other | Admitting: Cardiology

## 2018-08-27 VITALS — BP 134/84 | HR 76 | Ht 66.0 in | Wt 131.0 lb

## 2018-08-27 DIAGNOSIS — I110 Hypertensive heart disease with heart failure: Secondary | ICD-10-CM

## 2018-08-27 DIAGNOSIS — I5032 Chronic diastolic (congestive) heart failure: Secondary | ICD-10-CM | POA: Diagnosis not present

## 2018-08-27 DIAGNOSIS — I482 Chronic atrial fibrillation, unspecified: Secondary | ICD-10-CM

## 2018-08-27 DIAGNOSIS — J449 Chronic obstructive pulmonary disease, unspecified: Secondary | ICD-10-CM

## 2018-08-27 DIAGNOSIS — Z950 Presence of cardiac pacemaker: Secondary | ICD-10-CM | POA: Diagnosis not present

## 2018-08-27 DIAGNOSIS — R0789 Other chest pain: Secondary | ICD-10-CM

## 2018-08-27 DIAGNOSIS — R071 Chest pain on breathing: Secondary | ICD-10-CM

## 2018-08-27 NOTE — Patient Instructions (Signed)
Medication Instructions:  Your physician recommends that you continue on your current medications as directed. Please refer to the Current Medication list given to you today.  If you need a refill on your cardiac medications before your next appointment, please call your pharmacy.   Lab work: None  If you have labs (blood work) drawn today and your tests are completely normal, you will receive your results only by: Marland Kitchen MyChart Message (if you have MyChart) OR . A paper copy in the mail If you have any lab test that is abnormal or we need to change your treatment, we will call you to review the results.  Testing/Procedures: You have been referred to see our electrophysiologist, Dr. Elberta Fortis in [place]. Your appointment will be scheduled at the check out desk.   Follow-Up: At [facility], you and your health needs are our priority.  As part of our continuing mission to provide you with exceptional heart care, we have created designated Provider Care Teams.  These Care Teams include your primary Cardiologist (physician) and Advanced Practice Providers (APPs -  Physician Assistants and Nurse Practitioners) who all work together to provide you with the care you need, when you need it. You will need a follow up appointment in 6 months.  Please call our office 2 months in advance to schedule this appointment.

## 2018-09-11 DIAGNOSIS — J209 Acute bronchitis, unspecified: Secondary | ICD-10-CM

## 2018-09-11 HISTORY — DX: Acute bronchitis, unspecified: J20.9

## 2018-09-22 DIAGNOSIS — J441 Chronic obstructive pulmonary disease with (acute) exacerbation: Secondary | ICD-10-CM | POA: Diagnosis not present

## 2018-09-22 DIAGNOSIS — I4891 Unspecified atrial fibrillation: Secondary | ICD-10-CM | POA: Diagnosis not present

## 2018-09-22 DIAGNOSIS — G250 Essential tremor: Secondary | ICD-10-CM | POA: Diagnosis not present

## 2018-09-22 DIAGNOSIS — I2510 Atherosclerotic heart disease of native coronary artery without angina pectoris: Secondary | ICD-10-CM | POA: Diagnosis not present

## 2018-09-23 DIAGNOSIS — J441 Chronic obstructive pulmonary disease with (acute) exacerbation: Secondary | ICD-10-CM | POA: Diagnosis not present

## 2018-09-23 DIAGNOSIS — I2510 Atherosclerotic heart disease of native coronary artery without angina pectoris: Secondary | ICD-10-CM | POA: Diagnosis not present

## 2018-09-23 DIAGNOSIS — J449 Chronic obstructive pulmonary disease, unspecified: Secondary | ICD-10-CM

## 2018-09-23 DIAGNOSIS — I4891 Unspecified atrial fibrillation: Secondary | ICD-10-CM | POA: Diagnosis not present

## 2018-09-23 DIAGNOSIS — J189 Pneumonia, unspecified organism: Secondary | ICD-10-CM | POA: Diagnosis not present

## 2018-09-24 DIAGNOSIS — J189 Pneumonia, unspecified organism: Secondary | ICD-10-CM | POA: Diagnosis not present

## 2018-09-24 DIAGNOSIS — I4891 Unspecified atrial fibrillation: Secondary | ICD-10-CM | POA: Diagnosis not present

## 2018-09-24 DIAGNOSIS — J441 Chronic obstructive pulmonary disease with (acute) exacerbation: Secondary | ICD-10-CM | POA: Diagnosis not present

## 2018-09-24 DIAGNOSIS — I2510 Atherosclerotic heart disease of native coronary artery without angina pectoris: Secondary | ICD-10-CM | POA: Diagnosis not present

## 2018-10-16 ENCOUNTER — Encounter: Payer: Medicare Other | Admitting: Internal Medicine

## 2018-10-17 ENCOUNTER — Encounter: Payer: Self-pay | Admitting: Cardiology

## 2018-10-29 ENCOUNTER — Encounter: Payer: Self-pay | Admitting: *Deleted

## 2018-11-10 ENCOUNTER — Encounter: Payer: Medicare Other | Admitting: Cardiology

## 2018-11-10 NOTE — Progress Notes (Deleted)
Electrophysiology Office Note   Date:  11/10/2018   ID:  Kimberly JubaCarol B Cates, DOB 10/28/1933, MRN [id number]  PCP:  Gordan PaymentGrisso, Greg A., MD  Cardiologist:  Dulce SellarMunley Primary Electrophysiologist:  Ardell Aaronson Jorja LoaMartin Dudley Cooley, MD    No chief complaint on file.    History of Present Illness: Kimberly Raymond is a 85 y.o. female who is being seen today for the evaluation of atrial fibrillation at the request of Norman HerrlichBrian Munley. Presenting today for electrophysiology evaluation.  She has a history of chronic diastolic heart failure, permanent atrial fibrillation status post AV node ablation and Medtronic pacemaker, hypertension, COPD.    Today, she denies*** symptoms of palpitations, chest pain, shortness of breath, orthopnea, PND, lower extremity edema, claudication, dizziness, presyncope, syncope, bleeding, or neurologic sequela. The patient is tolerating medications without difficulties.    Past Medical History:  Diagnosis Date  . Age-related osteoporosis without current pathological fracture 05/18/2011   Last Assessment & Plan:  Relevant Hx: Course: Daily Update: Today's Plan:she is following with Dr. Albertha Gheeebecca Bassett for this and recieves prolia twice a year and is due for this in May  Electronically signed by: Krystal ClarkMelissa Joyce Brown-Patram, NP 02/16/16 2101  . Allergic rhinitis   . Amblyopia, right eye 11/08/2015  . Arthritis 05/18/2011  . Branch retinal vein occlusion of left eye 11/08/2015  . Cardiac pacemaker in situ 05/18/2011  . Cerebral thrombosis with cerebral infarction (HCC) 05/18/2011  . Chronic atrial fibrillation 11/22/2015   Overview:  With His ablation and permanent pacemaker for rate control, CHADS2 vasc score= 7  Overview:  Overview:  With His ablation and permanent pacemaker for rate control, CHADS2 vasc score= 7  Last Assessment & Plan:  Relevant Hx: Course: Daily Update: Today's Plan:she has pacemaker and is no longer taking eliquis   Electronically signed by: Krystal ClarkMelissa Joyce Brown-Patram, NP 02/16/16  2059  . Chronic diastolic heart failure (HCC) 11/22/2015  . Chronic edema 04/01/2017  . Chronic obstructive pulmonary disease (HCC) 10/18/2016  . Contracture of finger joint, left 10/30/2016  . COPD (chronic obstructive pulmonary disease) (HCC)   . Costochondral chest pain 11/22/2015  . Dupuytren's contracture of left hand 10/30/2016  . Epiretinal membrane (ERM) of left eye 11/08/2015  . Essential hypertension 02/01/2016   Last Assessment & Plan:  Relevant Hx: Course: Daily Update: Today's Plan:this is fair for her right now and with her being sick it is not going to be ideal right now Berneice Zettlemoyer follow  Electronically signed by: Krystal ClarkMelissa Joyce Brown-Patram, NP 02/16/16 2057  . Essential tremor 05/18/2011  . Hypertensive heart disease with heart failure (HCC) 11/22/2015  . Hypertensive retinopathy of both eyes 11/08/2015   Last Assessment & Plan:  Relevant Hx: Course: Daily Update: Today's Plan:she is following with specialist for this currently  Electronically signed by: Krystal ClarkMelissa Joyce Brown-Patram, NP 02/16/16 2059  . Laryngopharyngeal reflux   . Left epiretinal membrane 06/26/2016  . Mixed hyperlipidemia 02/01/2016   Last Assessment & Plan:  Relevant Hx: Course: Daily Update: Today's Plan:there is going to be fasting lipids done   Electronically signed by: Krystal ClarkMelissa Joyce Brown-Patram, NP 02/16/16 2058  . Nocturnal oxygen desaturation   . Non-rheumatic mitral regurgitation 01/08/2016   Overview:  Mild  . Osteoporosis 05/18/2011  . Posterior vitreous detachment of both eyes 06/26/2016  . Presence of cardiac pacemaker 05/18/2011  . Pseudophakia of both eyes 11/08/2015  . PVD (posterior vitreous detachment), both eyes 11/08/2015  . Retinal hemorrhage of both eyes 06/26/2016  . S/P revision of total knee  08/10/2013  . S/P total knee arthroplasty 12/29/2013   Past Surgical History:  Procedure Laterality Date  . ABDOMINAL HYSTERECTOMY    . BACK SURGERY    . FEMUR FRACTURE SURGERY Right   . FINGER SURGERY Right    5  finger  . PACEMAKER INSERTION    . PR PART PALMAR FASCIEC,OPEN 1 DIGIT Left 10/30/2016     . REPLACEMENT TOTAL KNEE Right   . REVISION TOTAL KNEE ARTHROPLASTY Right   . SKIN GRAFT    . TONSILLECTOMY    . TUBAL LIGATION       Current Outpatient Medications  Medication Sig Dispense Refill  . albuterol (PROVENTIL HFA;VENTOLIN HFA) 108 (90 BASE) MCG/ACT inhaler Inhale 2 puffs into the lungs every 4 (four) hours as needed for wheezing or shortness of breath.    . Ascorbic Acid (VITAMIN C) 1000 MG tablet Take 1 tablet by mouth daily.    . Biotin 1 MG CAPS Take 1 capsule by mouth daily.    . Calcium Carb-Cholecalciferol (CALCIUM-VITAMIN D) 500-200 MG-UNIT tablet Take 1 tablet by mouth daily.    . Cholecalciferol (VITAMIN D3) 2000 units capsule Take 1 capsule by mouth daily.    . cyanocobalamin (,VITAMIN B-12,) 1000 MCG/ML injection Inject 1,000 mcg into the muscle every 14 (fourteen) days.     Marland Kitchen. denosumab (PROLIA) 60 MG/ML SOLN injection Inject 60 mg into the skin every 6 (six) months.    . furosemide (LASIX) 20 MG tablet Take 20 mg by mouth 3 (three) times a week. Takes on Mon, Wed, and Fri unless weight has increased by 3 lbs    . Garlic 1500 MG CAPS Take 1,500 mg by mouth daily.    . isosorbide mononitrate (IMDUR) 30 MG 24 hr tablet Take 15 mg by mouth daily.   5  . meclizine (ANTIVERT) 25 MG tablet Take 25 mg by mouth daily as needed for dizziness.    . Multiple Vitamin (MULTIVITAMIN) capsule Take 1 capsule by mouth daily.    . primidone (MYSOLINE) 250 MG tablet Take 1 tablet by mouth as directed. Takes 2.5 tablets at night for 2 weeks, then increase to 4 tablets at night    . Probiotic Product (PROBIOTIC DAILY PO) Take 1 tablet by mouth daily.    . Red Yeast Rice Extract 600 MG CAPS Take 1 capsule by mouth daily.    Marland Kitchen. umeclidinium-vilanterol (ANORO ELLIPTA) 62.5-25 MCG/INH AEPB inhale 1 puff daily    . vitamin E 1000 UNIT capsule Take 1 capsule by mouth daily.     No current  facility-administered medications for this visit.     Allergies:   Lidocaine hcl; Procaine; Codeine; Epinephrine; Levofloxacin; Latex; and Sulfamethoxazole   Social History:  The patient  reports that she quit smoking about 33 years ago. She has never used smokeless tobacco. She reports that she does not drink alcohol or use drugs.   Family History:  The patient's family history includes ALS in her mother; Stroke in her sister; Throat cancer in her sister; Ulcers in her father.    ROS:  Please see the history of present illness.   Otherwise, review of systems is positive for ***.   All other systems are reviewed and negative.    PHYSICAL EXAM: VS:  There were no vitals taken for this visit. , BMI There is no height or weight on file to calculate BMI. GEN: Well nourished, well developed, in no acute distress  HEENT: normal  Neck: no JVD, carotid bruits,  or masses Cardiac: ***RRR; no murmurs, rubs, or gallops,no edema  Respiratory:  clear to auscultation bilaterally, normal work of breathing GI: soft, nontender, nondistended, + BS MS: no deformity or atrophy  Skin: warm and dry, device pocket is well healed Neuro:  Strength and sensation are intact Psych: euthymic mood, full affect  EKG:  EKG {ACTION; IS/IS IOM:[id number]} ordered today. Personal review of the ekg ordered shows ***  Device interrogation is reviewed today in detail.  See PaceArt for details.   Recent Labs: No results found for requested labs within last 8760 hours.    Lipid Panel  No results found for: CHOL, TRIG, HDL, CHOLHDL, VLDL, LDLCALC, LDLDIRECT   Wt Readings from Last 3 Encounters:  08/27/18 131 lb (59.4 kg)  05/05/18 131 lb (59.4 kg)  12/27/17 138 lb 12.8 oz (63 kg)      Other studies Reviewed: Additional studies/ records that were reviewed today include: Epic notes   ASSESSMENT AND PLAN:  1.  Permanent atrial fibrillation: Status post AV node ablation and Medtronic pacemaker implant.  This  patients CHA2DS2-VASc Score and unadjusted Ischemic Stroke Rate (% per year) is equal to 4.8 % stroke rate/year from a score of 4  Above score calculated as 1 point each if present [CHF, HTN, DM, Vascular=MI/PAD/Aortic Plaque, Age if 65-74, or Female] Above score calculated as 2 points each if present [Age > 75, or Stroke/TIA/TE]  2.  Chronic diastolic heart failure:***  3.  Hypertension:***   Current medicines are reviewed at length with the patient today.   The patient {ACTIONS; HAS/DOES NOT HAVE:19233} concerns regarding her medicines.  The following changes were made today:  {NONE DEFAULTED:18576::"none"}  Labs/ tests ordered today include: *** No orders of the defined types were placed in this encounter.    Disposition:   FU with Yousof Alderman {gen number [id number]} {Days to years:10300}  Signed, Caran Storck [name], MD  11/10/2018 9:00 AM     [facility] [address] [place] [address] [phone] (office) [phone] (fax)

## 2018-12-25 DIAGNOSIS — M778 Other enthesopathies, not elsewhere classified: Secondary | ICD-10-CM

## 2018-12-25 HISTORY — DX: Other enthesopathies, not elsewhere classified: M77.8

## 2019-01-01 NOTE — Progress Notes (Signed)
Cardiology Office Note:    Date:  01/02/2019   ID:  Kimberly Raymond, DOB 1933/12/03, MRN [id number]  PCP:  Gordan Payment., MD  Cardiologist:  Norman Herrlich, MD    Referring MD: Gordan Payment., MD    ASSESSMENT:    1. Chronic atrial fibrillation   2. Pacemaker   3. Hypertensive heart disease with heart failure (HCC)   4. Chronic obstructive pulmonary disease, unspecified COPD type (HCC)   5. Chronic diastolic heart failure (HCC)   6. Cardiac pacemaker in situ    PLAN:    In order of problems listed above:  Greater than 50% of the visit time of greater than 45 minutes was spent and counseling and care coordination specifically regarding a long discussion of benefits indication risk of anticoagulation therapy and a combined informed decision between the patient and her daughter and me regarding institution of anticoagulants.  The other complex issue was with the element of her shortness of breath that is cardiac at their request I stopped those doing open up the notes revealed him from pulmonary and reviewed both recent chest x-rays and a CT of the chest performed last year.  After discussion of options we decided to do a proBNP level to guide [place] if she needs intensification of heart failure treatment I asked him to discuss with pulmonary the best way to manage her shortness of breath is I think it is predominantly due to lung disease and debilitation  1. Stable rate is controlled after AV node ablation and pacemaker.  She finally agrees to accept anticoagulation with age and body mass will take one half dose Eliquis. 2. Stable function I reviewed her last download device clinic records and will continue to follow in our program 3. Stable she takes a low-dose diuretic she has no findings of heart failure.  Patient and her daughter tell me that pulmonary said she is short of breath because of heart disease somewhat surprised I see no overt evidence of decompensated heart failure try to resolve  this will check a proBNP level and if severely elevated can increase her diuretic.  Despite that I think her shortness of breath is due to her emphysema and pulmonary scarring 4. COPD managed by her pulmonologist I reviewed her recent chest x-ray CT scan and I just do not release test is congestive heart failure and I think the vast majority of her shortness of breath is due to debilitation and severe underlying lung disease that the element of heart failure is well compensated at this point continue her current diuretic   Next appointment: 6 months   Medication Adjustments/Labs and Tests Ordered: Current medicines are reviewed at length with the patient today.  Concerns regarding medicines are outlined above.  Orders Placed This Encounter  Procedures   Pro b natriuretic peptide (BNP)   Meds ordered this encounter  Medications   apixaban (ELIQUIS) 2.5 MG TABS tablet    Sig: Take 1 tablet (2.5 mg total) by mouth 2 (two) times daily.    Dispense:  60 tablet    Refill:  5    No chief complaint on file.   History of Present Illness:    Kimberly Raymond is a 85 y.o. female with a hx of COPD, CHF,chronic Atrial Fibrillation with AV nodal ablation for rate control with AVN ablation, Hypertension, non cardiac Chest Pain, and a pacemaker  last seen 08/27/18. Compliance with diet, lifestyle and medications: Yes  Her daughter is present we had  a nice discussion of risks benefits options and she elects to take low-dose anticoagulant she finds her self increasingly short of breath and seen by pulmonary and surprises me by telling me that she was told is due to decompensated heart failure I reviewed the CT and x-rays and physical exam and I just do not see it she will continue her current diuretic and was a proBNP level to give US guidance.  She has no orthopnea chest pain palpitation or syncope. Past Medical History:  Diagnosis Date   Age-related osteoporosis without current pathological fracture  05/18/2011   Last Assessment & Plan:  Relevant Hx: Course: Daily Update: Today's Plan:she is following with Dr. Albertha Ghee for this and recieves prolia twice a year and is due for this in May  Electronically signed by: [name], NP 02/16/16 2101   Allergic rhinitis    Amblyopia, right eye 11/08/2015   Arthritis 05/18/2011   Branch retinal vein occlusion of left eye 11/08/2015   Cardiac pacemaker in situ 05/18/2011   Cerebral thrombosis with cerebral infarction (HCC) 05/18/2011   Chronic atrial fibrillation 11/22/2015   Overview:  With His ablation and permanent pacemaker for rate control, CHADS2 vasc score= 7  Overview:  Overview:  With His ablation and permanent pacemaker for rate control, CHADS2 vasc score= 7  Last Assessment & Plan:  Relevant Hx: Course: Daily Update: Today's Plan:she has pacemaker and is no longer taking eliquis   Electronically signed by: [name], NP 02/16/16 2059   Chronic diastolic heart failure (HCC) 11/22/2015   Chronic edema 04/01/2017   Chronic obstructive pulmonary disease (HCC) 10/18/2016   Contracture of finger joint, left 10/30/2016   COPD (chronic obstructive pulmonary disease) (HCC)    Costochondral chest pain 11/22/2015   Dupuytren's contracture of left hand 10/30/2016   Epiretinal membrane (ERM) of left eye 11/08/2015   Essential hypertension 02/01/2016   Last Assessment & Plan:  Relevant Hx: Course: Daily Update: Today's Plan:this is fair for her right now and with her being sick it is not going to be ideal right now will follow  Electronically signed by: [name], NP 02/16/16 2057   Essential tremor 05/18/2011   Hypertensive heart disease with heart failure (HCC) 11/22/2015   Hypertensive retinopathy of both eyes 11/08/2015   Last Assessment & Plan:  Relevant Hx: Course: Daily Update: Today's Plan:she is following with specialist for this currently  Electronically signed by: [name], NP 02/16/16 2059   Laryngopharyngeal reflux    Left epiretinal membrane 06/26/2016   Mixed hyperlipidemia 02/01/2016   Last Assessment & Plan:  Relevant Hx: Course: Daily Update: Today's Plan:there is going to be fasting lipids done   Electronically signed by: [name], NP 02/16/16 2058   Nocturnal oxygen desaturation    Non-rheumatic mitral regurgitation 01/08/2016   Overview:  Mild   Osteoporosis 05/18/2011   Posterior vitreous detachment of both eyes 06/26/2016   Presence of cardiac pacemaker 05/18/2011   Pseudophakia of both eyes 11/08/2015   PVD (posterior vitreous detachment), both eyes 11/08/2015   Retinal hemorrhage of both eyes 06/26/2016   S/P revision of total knee 08/10/2013   S/P total knee arthroplasty 12/29/2013    Past Surgical History:  Procedure Laterality Date   ABDOMINAL HYSTERECTOMY     BACK SURGERY     FEMUR FRACTURE SURGERY Right    FINGER SURGERY Right    5 finger   PACEMAKER INSERTION     PR PART PALMAR FASCIEC,OPEN  1 DIGIT Left 10/30/2016      REPLACEMENT TOTAL KNEE Right    REVISION TOTAL KNEE ARTHROPLASTY Right    SKIN GRAFT     TONSILLECTOMY     TUBAL LIGATION      Current Medications: Current Meds  Medication Sig   albuterol (PROVENTIL HFA;VENTOLIN HFA) 108 (90 BASE) MCG/ACT inhaler Inhale 2 puffs into the lungs every 4 (four) hours as needed for wheezing or shortness of breath.   Ascorbic Acid (VITAMIN C) 1000 MG tablet Take 1 tablet by mouth daily.   Biotin 1 MG CAPS Take 1 capsule by mouth daily.   Cholecalciferol (VITAMIN D3) 2000 units capsule Take 1 capsule by mouth daily.   CRANBERRY EXTRACT PO Take 1 tablet by mouth daily.   cyanocobalamin (,VITAMIN B-12,) 1000 MCG/ML injection Inject 1,000 mcg into the muscle every 14 (fourteen) days.    denosumab (PROLIA) 60 MG/ML SOLN injection Inject 60 mg into the skin every 6 (six) months.   furosemide (LASIX) 20 MG tablet Take 20 mg by mouth 3  (three) times a week. Takes on Mon, Wed, and Fri unless weight has increased by 3 lbs   Garlic 1500 MG CAPS Take 1,500 mg by mouth daily.   isosorbide mononitrate (IMDUR) 30 MG 24 hr tablet Take 15 mg by mouth daily.    meclizine (ANTIVERT) 25 MG tablet Take 25 mg by mouth daily as needed for dizziness.   Multiple Vitamin (MULTIVITAMIN) capsule Take 1 capsule by mouth daily.   NON FORMULARY Take 1 capsule by mouth daily. CBD Oil- once daily   primidone (MYSOLINE) 250 MG tablet Take 1 tablet by mouth 2 (two) times daily.    Probiotic Product (PROBIOTIC DAILY PO) Take 1 tablet by mouth daily.   Red Yeast Rice Extract 600 MG CAPS Take 1 capsule by mouth daily.   traMADol (ULTRAM) 50 MG tablet Take 1 tablet (50 mg total) by mouth every 6 (six) hours as needed.   TURMERIC PO Take 1 tablet by mouth 2 (two) times daily.   umeclidinium-vilanterol (ANORO ELLIPTA) 62.5-25 MCG/INH AEPB inhale 1 puff daily   vitamin E 1000 UNIT capsule Take 1 capsule by mouth daily.     Allergies:   Lidocaine hcl; Procaine; Propranolol; Codeine; Epinephrine; Gabapentin; Levofloxacin; Latex; and Sulfamethoxazole   Social History   Socioeconomic History   Marital status: Divorced    Spouse name: Not on file   Number of children: Not on file   Years of education: Not on file   Highest education level: Not on file  Occupational History   Not on file  Social Needs   Financial resource strain: Not on file   Food insecurity:    Worry: Not on file    Inability: Not on file   Transportation needs:    Medical: Not on file    Non-medical: Not on file  Tobacco Use   Smoking status: Former Smoker    Last attempt to quit: 10/22/1985    Years since quitting: 33.2   Smokeless tobacco: Never Used  Substance and Sexual Activity   Alcohol use: No   Drug use: No   Sexual activity: Not on file  Lifestyle   Physical activity:    Days per week: Not on file    Minutes per session: Not on file    Stress: Not on file  Relationships   Social connections:    Talks on phone: Not on file    Gets together: Not on file  Attends religious service: Not on file    Active member of club or organization: Not on file    Attends meetings of clubs or organizations: Not on file    Relationship status: Not on file  Other Topics Concern   Not on file  Social History Narrative   Not on file     Family History: The patient's family history includes ALS in her mother; Stroke in her sister; Throat cancer in her sister; Ulcers in her father. ROS:   Please see the history of present illness.    All other systems reviewed and are negative.  EKGs/Labs/Other Studies Reviewed:    The following studies were reviewed today:  Recent Labs: 12/31/2018 cholesterol 141 LDL 69 HDL 58 last CMP shows normal renal and liver function No results found for requested labs within last 8760 hours.  Recent Lipid Panel No results found for: CHOL, TRIG, HDL, CHOLHDL, VLDL, LDLCALC, LDLDIRECT  Physical Exam:    VS:  BP (!) 148/80 (BP Location: Left Arm, Patient Position: Sitting, Cuff Size: Normal)    Pulse 78    Ht 5' 6.5" (1.689 m)    Wt 126 lb 12.8 oz (57.5 kg)    SpO2 92%    BMI 20.16 kg/m     Wt Readings from Last 3 Encounters:  01/02/19 126 lb 12.8 oz (57.5 kg)  08/27/18 131 lb (59.4 kg)  05/05/18 131 lb (59.4 kg)     GEN: Very frail looks chronically ill and debilitated well nourished, well developed in no acute distress HEENT: Normal NECK: No JVD; No carotid bruits LYMPHATICS: No lymphadenopathy CARDIAC: Variable S1 no S3 no murmurs, rubs, gallops RESPIRATORY:  Clear to auscultation without rales, wheezing or rhonchi  ABDOMEN: Soft, non-tender, non-distended MUSCULOSKELETAL:  No edema; No deformity  SKIN: Warm and dry NEUROLOGIC:  Alert and oriented x 3 PSYCHIATRIC:  Normal affect    Signed, Norman Herrlich, MD  01/02/2019 11:52 AM    [facility]

## 2019-01-02 ENCOUNTER — Other Ambulatory Visit: Payer: Self-pay

## 2019-01-02 ENCOUNTER — Encounter: Payer: Self-pay | Admitting: Cardiology

## 2019-01-02 ENCOUNTER — Ambulatory Visit (INDEPENDENT_AMBULATORY_CARE_PROVIDER_SITE_OTHER): Payer: Medicare Other | Admitting: Cardiology

## 2019-01-02 VITALS — BP 148/80 | HR 78 | Ht 66.5 in | Wt 126.8 lb

## 2019-01-02 DIAGNOSIS — I482 Chronic atrial fibrillation, unspecified: Secondary | ICD-10-CM | POA: Diagnosis not present

## 2019-01-02 DIAGNOSIS — I110 Hypertensive heart disease with heart failure: Secondary | ICD-10-CM

## 2019-01-02 DIAGNOSIS — I5032 Chronic diastolic (congestive) heart failure: Secondary | ICD-10-CM

## 2019-01-02 DIAGNOSIS — J449 Chronic obstructive pulmonary disease, unspecified: Secondary | ICD-10-CM | POA: Diagnosis not present

## 2019-01-02 DIAGNOSIS — Z950 Presence of cardiac pacemaker: Secondary | ICD-10-CM | POA: Diagnosis not present

## 2019-01-02 LAB — PRO B NATRIURETIC PEPTIDE: NT-Pro BNP: 621 pg/mL (ref 0–738)

## 2019-01-02 MED ORDER — APIXABAN 2.5 MG PO TABS: 2.5000 mg | ORAL_TABLET | Freq: Two times a day (BID) | ORAL | 5 refills | Status: DC

## 2019-01-02 NOTE — Patient Instructions (Signed)
Medication Instructions:  Your physician has recommended you make the following change in your medication:   START apixaban (eliquis) 2.5 mg: Take 1 tablet twice daily   If you need a refill on your cardiac medications before your next appointment, please call your pharmacy.   Lab work: Your physician recommends that you return for lab work today: ProBNP.   If you have labs (blood work) drawn today and your tests are completely normal, you will receive your results only by: Marland Kitchen MyChart Message (if you have MyChart) OR . A paper copy in the mail If you have any lab test that is abnormal or we need to change your treatment, we will call you to review the results.  Testing/Procedures: None  Follow-Up: At [facility], you and your health needs are our priority.  As part of our continuing mission to provide you with exceptional heart care, we have created designated Provider Care Teams.  These Care Teams include your primary Cardiologist (physician) and Advanced Practice Providers (APPs -  Physician Assistants and Nurse Practitioners) who all work together to provide you with the care you need, when you need it. You will need a follow up appointment in 6 months.      Apixaban oral tablets What is this medicine? APIXABAN (a PIX a ban) is an anticoagulant (blood thinner). It is used to lower the chance of stroke in people with a medical condition called atrial fibrillation. It is also used to treat or prevent blood clots in the lungs or in the veins. This medicine may be used for other purposes; ask your health care provider or pharmacist if you have questions. COMMON BRAND NAME(S): Eliquis What should I tell my health care provider before I take this medicine? They need to know if you have any of these conditions: -bleeding disorders -bleeding in the brain -blood in your stools (black or tarry stools) or if you have blood in your vomit -history of stomach bleeding -kidney  disease -liver disease -mechanical heart valve -an unusual or allergic reaction to apixaban, other medicines, foods, dyes, or preservatives -pregnant or trying to get pregnant -breast-feeding How should I use this medicine? Take this medicine by mouth with a glass of water. Follow the directions on the prescription label. You can take it with or without food. If it upsets your stomach, take it with food. Take your medicine at regular intervals. Do not take it more often than directed. Do not stop taking except on your doctor's advice. Stopping this medicine may increase your risk of a blood clot. Be sure to refill your prescription before you run out of medicine. Talk to your pediatrician regarding the use of this medicine in children. Special care may be needed. Overdosage: If you think you have taken too much of this medicine contact a poison control center or emergency room at once. NOTE: This medicine is only for you. Do not share this medicine with others. What if I miss a dose? If you miss a dose, take it as soon as you can. If it is almost time for your next dose, take only that dose. Do not take double or extra doses. What may interact with this medicine? This medicine may interact with the following: -aspirin and aspirin-like medicines -certain medicines for fungal infections like ketoconazole and itraconazole -certain medicines for seizures like carbamazepine and phenytoin -certain medicines that treat or prevent blood clots like warfarin, enoxaparin, and dalteparin -clarithromycin -NSAIDs, medicines for pain and inflammation, like ibuprofen or naproxen -  rifampin -ritonavir -St. John's wort This list may not describe all possible interactions. Give your health care provider a list of all the medicines, herbs, non-prescription drugs, or dietary supplements you use. Also tell them if you smoke, drink alcohol, or use illegal drugs. Some items may interact with your medicine. What  should I watch for while using this medicine? Visit your healthcare professional for regular checks on your progress. You may need blood work done while you are taking this medicine. Your condition will be monitored carefully while you are receiving this medicine. It is important not to miss any appointments. Avoid sports and activities that might cause injury while you are using this medicine. Severe falls or injuries can cause unseen bleeding. Be careful when using sharp tools or knives. Consider using an Neurosurgeon. Take special care brushing or flossing your teeth. Report any injuries, bruising, or red spots on the skin to your healthcare professional. If you are going to need surgery or other procedure, tell your healthcare professional that you are taking this medicine. Wear a medical ID bracelet or chain. Carry a card that describes your disease and details of your medicine and dosage times. What side effects may I notice from receiving this medicine? Side effects that you should report to your doctor or health care professional as soon as possible: -allergic reactions like skin rash, itching or hives, swelling of the face, lips, or tongue -signs and symptoms of bleeding such as bloody or black, tarry stools; red or dark-brown urine; spitting up blood or brown material that looks like coffee grounds; red spots on the skin; unusual bruising or bleeding from the eye, gums, or nose -signs and symptoms of a blood clot such as chest pain; shortness of breath; pain, swelling, or warmth in the leg -signs and symptoms of a stroke such as changes in vision; confusion; trouble speaking or understanding; severe headaches; sudden numbness or weakness of the face, arm or leg; trouble walking; dizziness; loss of coordination This list may not describe all possible side effects. Call your doctor for medical advice about side effects. You may report side effects to FDA at 1-800-FDA-1088. Where should I keep  my medicine? Keep out of the reach of children. Store at room temperature between 20 and 25 degrees C (68 and 77 degrees F). Throw away any unused medicine after the expiration date. NOTE: This sheet is a summary. It may not cover all possible information. If you have questions about this medicine, talk to your doctor, pharmacist, or health care provider.  2019 Elsevier/Gold Standard (2017-10-03 11:20:07)

## 2019-01-16 ENCOUNTER — Telehealth: Payer: Self-pay | Admitting: *Deleted

## 2019-01-16 NOTE — Telephone Encounter (Signed)
Called patient to let her know due to recent COVID19 CDC Protocols, we are not seeing patients in the office on Monday in [place]. She agreed to a telephone visit and declined MyChart or WebEx usage.

## 2019-01-19 ENCOUNTER — Encounter: Payer: Medicare Other | Admitting: Cardiology

## 2019-02-04 ENCOUNTER — Ambulatory Visit: Payer: Medicare Other | Admitting: Cardiology

## 2019-02-09 ENCOUNTER — Other Ambulatory Visit: Payer: Self-pay

## 2019-02-09 ENCOUNTER — Ambulatory Visit (INDEPENDENT_AMBULATORY_CARE_PROVIDER_SITE_OTHER): Payer: Medicare Other | Admitting: *Deleted

## 2019-02-09 DIAGNOSIS — I482 Chronic atrial fibrillation, unspecified: Secondary | ICD-10-CM

## 2019-02-10 ENCOUNTER — Telehealth: Payer: Self-pay

## 2019-02-10 LAB — CUP PACEART REMOTE DEVICE CHECK
Battery Impedance: 2144 Ohm
Battery Remaining Longevity: 32 mo
Battery Voltage: 2.75 V
Brady Statistic RV Percent Paced: 100 %
Date Time Interrogation Session: 20200421172207
Implantable Lead Implant Date: 20050802
Implantable Lead Location: 753860
Implantable Lead Model: 4076
Implantable Pulse Generator Implant Date: 20140717
Lead Channel Impedance Value: 0 Ohm
Lead Channel Impedance Value: 575 Ohm
Lead Channel Pacing Threshold Amplitude: 0.5 V
Lead Channel Pacing Threshold Pulse Width: 0.4 ms
Lead Channel Setting Pacing Amplitude: 2.5 V
Lead Channel Setting Pacing Pulse Width: 0.4 ms
Lead Channel Setting Sensing Sensitivity: 4 mV

## 2019-02-10 NOTE — Telephone Encounter (Signed)
Spoke with patient to remind of missed remote transmission 

## 2019-02-18 ENCOUNTER — Encounter: Payer: Self-pay | Admitting: Cardiology

## 2019-02-18 NOTE — Progress Notes (Signed)
Remote pacemaker transmission.   

## 2019-04-27 ENCOUNTER — Telehealth: Payer: Self-pay | Admitting: *Deleted

## 2019-04-27 NOTE — Telephone Encounter (Signed)
LVMTCB Calling patient to set up a virtual visit on 7/13 with Dr. Curt Bears

## 2019-04-29 ENCOUNTER — Telehealth: Payer: Self-pay | Admitting: Cardiology

## 2019-04-29 NOTE — Telephone Encounter (Signed)
New Message ° ° ° °Left message to confirm appt and get consent  °

## 2019-04-30 ENCOUNTER — Other Ambulatory Visit: Payer: Self-pay

## 2019-04-30 ENCOUNTER — Telehealth (INDEPENDENT_AMBULATORY_CARE_PROVIDER_SITE_OTHER): Payer: Medicare Other | Admitting: Cardiology

## 2019-04-30 DIAGNOSIS — I482 Chronic atrial fibrillation, unspecified: Secondary | ICD-10-CM

## 2019-04-30 NOTE — Progress Notes (Signed)
Virtual Visit via Telephone Note   This visit type was conducted due to national recommendations for restrictions regarding the COVID-19 Pandemic (e.g. social distancing) in an effort to limit this patient's exposure and mitigate transmission in our community.  Due to her co-morbid illnesses, this patient is at least at moderate risk for complications without adequate follow up.  This format is felt to be most appropriate for this patient at this time.  The patient did not have access to video technology/had technical difficulties with video requiring transitioning to audio format only (telephone).  All issues noted in this document were discussed and addressed.  No physical exam could be performed with this format.  Please refer to the patient's chart for her  consent to telehealth for [facility].   Date:  04/30/2019   ID:  Kimberly Raymond, DOB 23-Dec-1933, MRN [id number]  Patient Location: Home Provider Location: Home  PCP:  [name], MD  Cardiologist:  [facility] Electrophysiologist:  None   Evaluation Performed:  Consultation - Kimberly Raymond was referred by Shirlee More for the evaluation of pacemaker.  Chief Complaint: Pacemaker  History of Present Illness:    Kimberly Raymond is a 85 y.o. female with history significant for COPD, CHF, chronic atrial fibrillation and is now status post AV node ablation and pacemaker, hypertension, noncardiac chest pain.  She is being referred for evaluation and follow-up of her pacemaker.  Today, denies symptoms of palpitations, chest pain, shortness of breath, orthopnea, PND, lower extremity edema, claudication, dizziness, presyncope, syncope, bleeding, or neurologic sequela. The patient is tolerating medications without difficulties.   The patient does not have symptoms concerning for COVID-19 infection (fever, chills, cough, or new shortness of breath).    Past Medical History:  Diagnosis Date  . Age-related osteoporosis without current  pathological fracture 05/18/2011   Last Assessment & Plan:  Relevant Hx: Course: Daily Update: Today's Plan:she is following with Dr. Wandra Feinstein for this and recieves prolia twice a year and is due for this in May  Electronically signed by: Mayer Camel, NP 02/16/16 2101  . Allergic rhinitis   . Amblyopia, right eye 11/08/2015  . Arthritis 05/18/2011  . Branch retinal vein occlusion of left eye 11/08/2015  . Cardiac pacemaker in situ 05/18/2011  . Cerebral thrombosis with cerebral infarction ([place]) 05/18/2011  . Chronic atrial fibrillation 11/22/2015   Overview:  With His ablation and permanent pacemaker for rate control, CHADS2 vasc score= 7  Overview:  Overview:  With His ablation and permanent pacemaker for rate control, CHADS2 vasc score= 7  Last Assessment & Plan:  Relevant Hx: Course: Daily Update: Today's Plan:she has pacemaker and is no longer taking eliquis   Electronically signed by: Mayer Camel, NP 02/16/16 2059  . Chronic diastolic heart failure ([place]) 11/22/2015  . Chronic edema 04/01/2017  . Chronic obstructive pulmonary disease ([place]) 10/18/2016  . Contracture of finger joint, left 10/30/2016  . COPD (chronic obstructive pulmonary disease) ([place])   . Costochondral chest pain 11/22/2015  . Dupuytren's contracture of left hand 10/30/2016  . Epiretinal membrane (ERM) of left eye 11/08/2015  . Essential hypertension 02/01/2016   Last Assessment & Plan:  Relevant Hx: Course: Daily Update: Today's Plan:this is fair for her right now and with her being sick it is not going to be ideal right now Ajani Rineer follow  Electronically signed by: Mayer Camel, NP 02/16/16 2057  . Essential tremor 05/18/2011  . Hypertensive heart disease with heart failure ([place])  11/22/2015  . Hypertensive retinopathy of both eyes 11/08/2015   Last Assessment & Plan:  Relevant Hx: Course: Daily Update: Today's Plan:she is following with specialist for this currently  Electronically signed by:  Krystal ClarkMelissa Joyce Brown-Patram, NP 02/16/16 2059  . Laryngopharyngeal reflux   . Left epiretinal membrane 06/26/2016  . Mixed hyperlipidemia 02/01/2016   Last Assessment & Plan:  Relevant Hx: Course: Daily Update: Today's Plan:there is going to be fasting lipids done   Electronically signed by: Krystal ClarkMelissa Joyce Brown-Patram, NP 02/16/16 2058  . Nocturnal oxygen desaturation   . Non-rheumatic mitral regurgitation 01/08/2016   Overview:  Mild  . Osteoporosis 05/18/2011  . Posterior vitreous detachment of both eyes 06/26/2016  . Presence of cardiac pacemaker 05/18/2011  . Pseudophakia of both eyes 11/08/2015  . PVD (posterior vitreous detachment), both eyes 11/08/2015  . Retinal hemorrhage of both eyes 06/26/2016  . S/P revision of total knee 08/10/2013  . S/P total knee arthroplasty 12/29/2013   Past Surgical History:  Procedure Laterality Date  . ABDOMINAL HYSTERECTOMY    . BACK SURGERY    . FEMUR FRACTURE SURGERY Right   . FINGER SURGERY Right    5 finger  . PACEMAKER INSERTION    . PR PART PALMAR FASCIEC,OPEN 1 DIGIT Left 10/30/2016     . REPLACEMENT TOTAL KNEE Right   . REVISION TOTAL KNEE ARTHROPLASTY Right   . SKIN GRAFT    . TONSILLECTOMY    . TUBAL LIGATION       No outpatient medications have been marked as taking for the 04/30/19 encounter (Appointment) with Regan Lemmingamnitz, Heith Haigler Martin, MD.     Allergies:   Lidocaine hcl, Procaine, Propranolol, Codeine, Epinephrine, Gabapentin, Levofloxacin, Latex, and Sulfamethoxazole   Social History   Tobacco Use  . Smoking status: Former Smoker    Quit date: 10/22/1985    Years since quitting: 33.5  . Smokeless tobacco: Never Used  Substance Use Topics  . Alcohol use: No  . Drug use: No     Family Hx: The patient's family history includes ALS in her mother; Stroke in her sister; Throat cancer in her sister; Ulcers in her father.  ROS:   Please see the history of present illness.     All other systems reviewed and are negative.   Prior CV  studies:   The following studies were reviewed today:  Remote device interrogation 02/10/2019 personally reviewed reveals atrial fibrillation  Labs/Other Tests and Data Reviewed:    EKG:  An ECG dated 03/24/2018 was personally reviewed today and demonstrated:  Atrial fibrillation, ventricular paced  Recent Labs: 01/02/2019: NT-Pro BNP 621   Recent Lipid Panel No results found for: CHOL, TRIG, HDL, CHOLHDL, LDLCALC, LDLDIRECT  Wt Readings from Last 3 Encounters:  01/02/19 126 lb 12.8 oz (57.5 kg)  08/27/18 131 lb (59.4 kg)  05/05/18 131 lb (59.4 kg)     Objective:    Vital Signs:  There were no vitals taken for this visit.   GEN:  no acute distress RESPIRATORY:  normal respiratory effort, symmetric expansion PSYCH:  normal affect  ASSESSMENT & PLAN:    1. Permanent atrial fibrillation: Status post AV node ablation.  Is currently anticoagulated with Eliquis. This patients CHA2DS2-VASc Score and unadjusted Ischemic Stroke Rate (% per year) is equal to 4.8 % stroke rate/year from a score of 4  Above score calculated as 1 point each if present [CHF, HTN, DM, Vascular=MI/PAD/Aortic Plaque, Age if 65-74, or Female] Above score calculated as 2 points each  if present [Age > 75, or Stroke/TIA/TE]  2. Medtronic dual-chamber pacemaker: Initial implant in 2005. Device functioning appropriately.  2-1/2 years left on the battery.  No changes at this time.  We Anvita Hirata enroll her in our pacemaker clinic. 3. Hypertension:has not checked today. Per primary cardiology  COVID-19 Education: The signs and symptoms of COVID-19 were discussed with the patient and how to seek care for testing (follow up with PCP or arrange E-visit).  5The importance of social distancing was discussed today.  Time:   Today, I have spent 5 minutes with the patient with telehealth technology discussing the above problems.     Medication Adjustments/Labs and Tests Ordered: Current medicines are reviewed at length with  the patient today.  Concerns regarding medicines are outlined above.   Tests Ordered: No orders of the defined types were placed in this encounter.   Medication Changes: No orders of the defined types were placed in this encounter.   Follow Up:  Virtual Visit or In Person in 1 year(s)  Signed, Maisee Vollman Jorja LoaMartin Danitra Payano, MD  04/30/2019 11:56 AM    [facility]

## 2019-05-11 ENCOUNTER — Ambulatory Visit (INDEPENDENT_AMBULATORY_CARE_PROVIDER_SITE_OTHER): Payer: Medicare Other | Admitting: *Deleted

## 2019-05-11 DIAGNOSIS — I482 Chronic atrial fibrillation, unspecified: Secondary | ICD-10-CM | POA: Diagnosis not present

## 2019-05-12 ENCOUNTER — Telehealth: Payer: Self-pay

## 2019-05-12 NOTE — Telephone Encounter (Signed)
Spoke with patient to remind of missed remote transmission 

## 2019-05-13 LAB — CUP PACEART REMOTE DEVICE CHECK
Battery Impedance: 2264 Ohm
Battery Remaining Longevity: 30 mo
Battery Voltage: 2.74 V
Brady Statistic RV Percent Paced: 100 %
Date Time Interrogation Session: 20200721173904
Implantable Lead Implant Date: 20050802
Implantable Lead Location: 753860
Implantable Lead Model: 4076
Implantable Pulse Generator Implant Date: 20140717
Lead Channel Impedance Value: 0 Ohm
Lead Channel Impedance Value: 548 Ohm
Lead Channel Pacing Threshold Amplitude: 0.5 V
Lead Channel Pacing Threshold Pulse Width: 0.4 ms
Lead Channel Setting Pacing Amplitude: 2.5 V
Lead Channel Setting Pacing Pulse Width: 0.4 ms
Lead Channel Setting Sensing Sensitivity: 4 mV

## 2019-05-26 ENCOUNTER — Encounter: Payer: Self-pay | Admitting: Cardiology

## 2019-05-26 NOTE — Progress Notes (Signed)
Remote pacemaker transmission.   

## 2019-07-13 ENCOUNTER — Encounter (HOSPITAL_COMMUNITY): Payer: Self-pay | Admitting: Emergency Medicine

## 2019-07-13 ENCOUNTER — Emergency Department (HOSPITAL_COMMUNITY): Payer: Medicare Other

## 2019-07-13 ENCOUNTER — Emergency Department (HOSPITAL_COMMUNITY)
Admission: EM | Admit: 2019-07-13 | Discharge: 2019-07-14 | Disposition: A | Discharge status: Home / Self Care | Payer: Medicare Other | Attending: Emergency Medicine | Admitting: Emergency Medicine

## 2019-07-13 DIAGNOSIS — I10 Essential (primary) hypertension: Secondary | ICD-10-CM | POA: Insufficient documentation

## 2019-07-13 DIAGNOSIS — Z20828 Contact with and (suspected) exposure to other viral communicable diseases: Secondary | ICD-10-CM | POA: Diagnosis not present

## 2019-07-13 DIAGNOSIS — J449 Chronic obstructive pulmonary disease, unspecified: Secondary | ICD-10-CM | POA: Diagnosis not present

## 2019-07-13 DIAGNOSIS — R0602 Shortness of breath: Secondary | ICD-10-CM

## 2019-07-13 DIAGNOSIS — Z87891 Personal history of nicotine dependence: Secondary | ICD-10-CM | POA: Insufficient documentation

## 2019-07-13 DIAGNOSIS — Z950 Presence of cardiac pacemaker: Secondary | ICD-10-CM | POA: Diagnosis not present

## 2019-07-13 DIAGNOSIS — Z91040 Latex allergy status: Secondary | ICD-10-CM | POA: Diagnosis not present

## 2019-07-13 DIAGNOSIS — R0789 Other chest pain: Secondary | ICD-10-CM | POA: Diagnosis not present

## 2019-07-13 DIAGNOSIS — R5383 Other fatigue: Secondary | ICD-10-CM

## 2019-07-13 DIAGNOSIS — Z79899 Other long term (current) drug therapy: Secondary | ICD-10-CM | POA: Diagnosis not present

## 2019-07-13 LAB — URINALYSIS, ROUTINE W REFLEX MICROSCOPIC
Bilirubin Urine: NEGATIVE
Glucose, UA: NEGATIVE mg/dL
Hgb urine dipstick: NEGATIVE
Ketones, ur: NEGATIVE mg/dL
Leukocytes,Ua: NEGATIVE
Nitrite: NEGATIVE
Protein, ur: NEGATIVE mg/dL
Specific Gravity, Urine: 1.008 (ref 1.005–1.030)
pH: 7 (ref 5.0–8.0)

## 2019-07-13 LAB — COMPREHENSIVE METABOLIC PANEL
ALT: 20 U/L (ref 0–44)
AST: 23 U/L (ref 15–41)
Albumin: 4 g/dL (ref 3.5–5.0)
Alkaline Phosphatase: 79 U/L (ref 38–126)
Anion gap: 7 (ref 5–15)
BUN: 8 mg/dL (ref 8–23)
CO2: 27 mmol/L (ref 22–32)
Calcium: 9 mg/dL (ref 8.9–10.3)
Chloride: 105 mmol/L (ref 98–111)
Creatinine, Ser: 0.58 mg/dL (ref 0.44–1.00)
GFR calc Af Amer: >60 mL/min (ref >60)
GFR calc non Af Amer: >60 mL/min (ref >60)
Glucose, Bld: 95 mg/dL (ref 70–99)
Potassium: 4.1 mmol/L (ref 3.5–5.1)
Sodium: 139 mmol/L (ref 135–145)
Total Bilirubin: 0.4 mg/dL (ref 0.3–1.2)
Total Protein: 7.1 g/dL (ref 6.5–8.1)

## 2019-07-13 LAB — CBC WITH DIFFERENTIAL/PLATELET
Abs Immature Granulocytes: 0.02 10*3/uL (ref 0.00–0.07)
Basophils Absolute: 0 10*3/uL (ref 0.0–0.1)
Basophils Relative: 1 %
Eosinophils Absolute: 0.2 10*3/uL (ref 0.0–0.5)
Eosinophils Relative: 4 %
HCT: 34.5 % — ABNORMAL LOW (ref 36.0–46.0)
Hemoglobin: 12.3 g/dL (ref 12.0–15.0)
Immature Granulocytes: 0 %
Lymphocytes Relative: 25 %
Lymphs Abs: 1.5 10*3/uL (ref 0.7–4.0)
MCH: 31.1 pg (ref 26.0–34.0)
MCHC: 35.7 g/dL (ref 30.0–36.0)
MCV: 87.1 fL (ref 80.0–100.0)
Monocytes Absolute: 0.4 10*3/uL (ref 0.1–1.0)
Monocytes Relative: 7 %
Neutro Abs: 3.8 10*3/uL (ref 1.7–7.7)
Neutrophils Relative %: 63 %
Platelets: 177 10*3/uL (ref 150–400)
RBC: 3.96 MIL/uL (ref 3.87–5.11)
RDW: 13.3 % (ref 11.5–15.5)
WBC: 6 10*3/uL (ref 4.0–10.5)
nRBC: 0 % (ref 0.0–0.2)

## 2019-07-13 LAB — TROPONIN I (HIGH SENSITIVITY): Troponin I (High Sensitivity): 8 ng/L (ref <18)

## 2019-07-13 LAB — BRAIN NATRIURETIC PEPTIDE: B Natriuretic Peptide: 148.8 pg/mL — ABNORMAL HIGH (ref 0.0–100.0)

## 2019-07-13 LAB — LIPASE, BLOOD: Lipase: 27 U/L (ref 11–51)

## 2019-07-13 NOTE — ED Provider Notes (Signed)
[facility] EMERGENCY DEPARTMENT Provider Note   CSN: [id number] Arrival date & time: 07/13/19  1402     History   Chief Complaint Chief Complaint  Patient presents with  . Shortness of Breath  . Chest Pain    HPI Kimberly Raymond is a 85 y.o. female.     85yo F w/ PMH including COPD, A. Fib, HTN, CVA who p/w malaise, fatigue, CP and SOB. Pt reports 3 days of fatigue, generalized weakness, decreased appetite.  She reports some mild chest discomfort, non-radiating. Mild SOB. No new cough. Denies fevers, vomiting, diarrhea, or urinary symptoms. She does not take anticoagulation.   The history is provided by the patient.    Past Medical History:  Diagnosis Date  . Age-related osteoporosis without current pathological fracture 05/18/2011   Last Assessment & Plan:  Relevant Hx: Course: Daily Update: Today's Plan:she is following with Dr. Wandra Feinstein for this and recieves prolia twice a year and is due for this in May  Electronically signed by: Mayer Camel, NP 02/16/16 2101  . Allergic rhinitis   . Amblyopia, right eye 11/08/2015  . Arthritis 05/18/2011  . Branch retinal vein occlusion of left eye 11/08/2015  . Cardiac pacemaker in situ 05/18/2011  . Cerebral thrombosis with cerebral infarction ([place]) 05/18/2011  . Chronic atrial fibrillation 11/22/2015   Overview:  With His ablation and permanent pacemaker for rate control, CHADS2 vasc score= 7  Overview:  Overview:  With His ablation and permanent pacemaker for rate control, CHADS2 vasc score= 7  Last Assessment & Plan:  Relevant Hx: Course: Daily Update: Today's Plan:she has pacemaker and is no longer taking eliquis   Electronically signed by: Mayer Camel, NP 02/16/16 2059  . Chronic diastolic heart failure ([place]) 11/22/2015  . Chronic edema 04/01/2017  . Chronic obstructive pulmonary disease ([place]) 10/18/2016  . Contracture of finger joint, left 10/30/2016  . COPD (chronic obstructive  pulmonary disease) ([place])   . Costochondral chest pain 11/22/2015  . Dupuytren's contracture of left hand 10/30/2016  . Epiretinal membrane (ERM) of left eye 11/08/2015  . Essential hypertension 02/01/2016   Last Assessment & Plan:  Relevant Hx: Course: Daily Update: Today's Plan:this is fair for her right now and with her being sick it is not going to be ideal right now will follow  Electronically signed by: Mayer Camel, NP 02/16/16 2057  . Essential tremor 05/18/2011  . Hypertensive heart disease with heart failure ([place]) 11/22/2015  . Hypertensive retinopathy of both eyes 11/08/2015   Last Assessment & Plan:  Relevant Hx: Course: Daily Update: Today's Plan:she is following with specialist for this currently  Electronically signed by: Mayer Camel, NP 02/16/16 2059  . Laryngopharyngeal reflux   . Left epiretinal membrane 06/26/2016  . Mixed hyperlipidemia 02/01/2016   Last Assessment & Plan:  Relevant Hx: Course: Daily Update: Today's Plan:there is going to be fasting lipids done   Electronically signed by: Mayer Camel, NP 02/16/16 2058  . Nocturnal oxygen desaturation   . Non-rheumatic mitral regurgitation 01/08/2016   Overview:  Mild  . Osteoporosis 05/18/2011  . Posterior vitreous detachment of both eyes 06/26/2016  . Presence of cardiac pacemaker 05/18/2011  . Pseudophakia of both eyes 11/08/2015  . PVD (posterior vitreous detachment), both eyes 11/08/2015  . Retinal hemorrhage of both eyes 06/26/2016  . S/P revision of total knee 08/10/2013  . S/P total knee arthroplasty 12/29/2013    Patient Active Problem List   Diagnosis Date Noted  .  Acute bronchitis, complicated 09/11/2018  . Chronic rhinitis 05/22/2018  . Iron deficiency 05/22/2018  . Chronic pansinusitis 05/22/2018  . Other dietary vitamin B12 deficiency anemia 04/15/2018  . Pressure injury of skin of dorsum of right foot 04/10/2018  . Coronary artery calcification seen on CAT scan 04/01/2018  .  Supplemental oxygen dependent 03/21/2018  . Chronic respiratory failure with hypoxia (HCC) 03/06/2018  . Aortic atherosclerosis (HCC) 02/17/2018  . Shortness of breath 12/30/2017  . Rib pain on right side 12/27/2017  . Gait abnormality 08/15/2017  . Multiple falls 08/15/2017  . Chronic bilateral back pain 08/14/2017  . High risk medication use 05/30/2017  . Chronic edema 04/01/2017  . Contracture of finger joint, left 10/30/2016  . Dupuytren's contracture of left hand 10/30/2016  . Chronic obstructive pulmonary disease (HCC) 10/18/2016  . Left epiretinal membrane 06/26/2016  . Posterior vitreous detachment of both eyes 06/26/2016  . Retinal hemorrhage of both eyes 06/26/2016  . Dizziness 05/22/2016  . History of dizziness 05/22/2016  . Mixed hyperlipidemia 02/01/2016  . Non-rheumatic mitral regurgitation 01/08/2016  . Chronic atrial fibrillation 11/22/2015  . Chronic diastolic heart failure (HCC) 11/22/2015  . Costochondral chest pain 11/22/2015  . Hypertensive heart disease with heart failure (HCC) 11/22/2015  . Amblyopia, right eye 11/08/2015  . Branch retinal vein occlusion of left eye 11/08/2015  . Epiretinal membrane (ERM) of left eye 11/08/2015  . Hypertensive retinopathy of both eyes 11/08/2015  . Pseudophakia of both eyes 11/08/2015  . PVD (posterior vitreous detachment), both eyes 11/08/2015  . S/P total knee arthroplasty 12/29/2013  . S/P revision of total knee 08/10/2013  . Age-related osteoporosis without current pathological fracture 05/18/2011  . Arthritis 05/18/2011  . Cardiac pacemaker in situ 05/18/2011  . Cerebral thrombosis with cerebral infarction (HCC) 05/18/2011  . Essential tremor 05/18/2011  . Osteoporosis 05/18/2011  . Pacemaker 05/18/2011    Past Surgical History:  Procedure Laterality Date  . ABDOMINAL HYSTERECTOMY    . BACK SURGERY    . FEMUR FRACTURE SURGERY Right   . FINGER SURGERY Right    5 finger  . PACEMAKER INSERTION    . PR PART  PALMAR FASCIEC,OPEN 1 DIGIT Left 10/30/2016     . REPLACEMENT TOTAL KNEE Right   . REVISION TOTAL KNEE ARTHROPLASTY Right   . SKIN GRAFT    . TONSILLECTOMY    . TUBAL LIGATION       OB History   No obstetric history on file.      Home Medications    Prior to Admission medications   Medication Sig Start Date End Date Taking? Authorizing Provider  albuterol (PROVENTIL HFA;VENTOLIN HFA) 108 (90 BASE) MCG/ACT inhaler Inhale 2 puffs into the lungs every 4 (four) hours as needed for wheezing or shortness of breath.   Yes [provider]  Ascorbic Acid (VITAMIN C) 1000 MG tablet Take 1 tablet by mouth daily.   Yes [provider]  Biotin 1 MG CAPS Take 1 capsule by mouth daily.   Yes [provider]  Cholecalciferol (VITAMIN D3) 2000 units capsule Take 2 capsules by mouth daily.    Yes [provider]  CRANBERRY EXTRACT PO Take 1 tablet by mouth daily.   Yes [provider]  cyanocobalamin (,VITAMIN B-12,) 1000 MCG/ML injection Inject 1,000 mcg into the muscle every 14 (fourteen) days.  04/28/15  Yes [provider]  denosumab (PROLIA) 60 MG/ML SOLN injection Inject 60 mg into the skin every 6 (six) months.   Yes [provider]  Dextran 70-Hypromellose (ARTIFICIAL TEARS PF OP) Place 1 drop into both eyes 3 (three) times daily.   Yes [provider]  furosemide (LASIX) 20 MG tablet Take 20 mg by mouth 3 (three) times a week. Takes on Tues, Thurs, and Saturday unless weight has increased by 3 lbs 07/16/16  Yes [provider]  Garlic 1500 MG CAPS Take 1,500 mg by mouth daily.   Yes [provider]  isosorbide mononitrate (IMDUR) 30 MG 24 hr tablet Take 15 mg by mouth daily.  05/14/18  Yes [provider]  Multiple Vitamin (MULTIVITAMIN) capsule Take 1 capsule by mouth daily.   Yes [provider]  primidone (MYSOLINE) 250 MG tablet Take 1 tablet by mouth 2 (two) times daily.  06/12/16  Yes  [provider]  Probiotic Product (PROBIOTIC DAILY PO) Take 1 tablet by mouth daily.   Yes [provider]  Red Yeast Rice Extract 600 MG CAPS Take 1 capsule by mouth daily.   Yes [provider]  traMADol (ULTRAM) 50 MG tablet Take 50 mg by mouth every 6 (six) hours as needed for moderate pain.  12/31/18  Yes [provider]  TURMERIC PO Take 1 tablet by mouth 2 (two) times daily.   Yes [provider]  umeclidinium-vilanterol (ANORO ELLIPTA) 62.5-25 MCG/INH AEPB Inhale 1 puff into the lungs daily.  07/04/16  Yes [provider]  vitamin E 1000 UNIT capsule Take 1 capsule by mouth daily.   Yes [provider]    Family History Family History  Problem Relation Age of Onset  . ALS Mother   . Ulcers Father   . Throat cancer Sister   . Stroke Sister     Social History Social History   Tobacco Use  . Smoking status: Former Smoker    Quit date: 10/22/1985    Years since quitting: 33.7  . Smokeless tobacco: Never Used  Substance Use Topics  . Alcohol use: No  . Drug use: No     Allergies   Lidocaine hcl, Procaine, Propranolol, Codeine, Epinephrine, Gabapentin, Levofloxacin, Latex, and Sulfamethoxazole   Review of Systems Review of Systems All other systems reviewed and are negative except that which was mentioned in HPI   Physical Exam Updated Vital Signs BP (!) 156/72   Pulse 73   Temp 98.1 F (36.7 C) (Oral)   Resp (!) 21   SpO2 93%   Physical Exam Vitals signs and nursing note reviewed.  Constitutional:      General: She is not in acute distress.    Appearance: She is well-developed.  HENT:     Head: Normocephalic and atraumatic.  Eyes:     Conjunctiva/sclera: Conjunctivae normal.     Pupils: Pupils are equal, round, and reactive to light.  Neck:     Musculoskeletal: Neck supple.  Cardiovascular:     Rate and Rhythm: Normal rate and regular rhythm.     Heart sounds: Normal heart sounds. No murmur.   Pulmonary:     Effort: Pulmonary effort is normal.     Breath sounds: Normal breath sounds.  Abdominal:     General: Bowel sounds are normal. There is no distension.     Palpations: Abdomen is soft.     Tenderness: There is no abdominal tenderness.  Musculoskeletal:     Right lower leg: No edema.     Left lower leg: No edema.  Skin:    General: Skin is warm and dry.  Neurological:  Mental Status: She is alert and oriented to person, place, and time.     Comments: Fluent speech  Psychiatric:        Judgment: Judgment normal.      ED Treatments / Results  Labs (all labs ordered are listed, but only abnormal results are displayed) Labs Reviewed  CBC WITH DIFFERENTIAL/PLATELET - Abnormal; Notable for the following components:      Result Value   HCT 34.5 (*)    All other components within normal limits  BRAIN NATRIURETIC PEPTIDE - Abnormal; Notable for the following components:   B Natriuretic Peptide 148.8 (*)    All other components within normal limits  SARS CORONAVIRUS 2 (TAT 6-24 HRS)  COMPREHENSIVE METABOLIC PANEL  URINALYSIS, ROUTINE W REFLEX MICROSCOPIC  LIPASE, BLOOD  D-DIMER, QUANTITATIVE (NOT AT [facility])  TROPONIN I (HIGH SENSITIVITY)  TROPONIN I (HIGH SENSITIVITY)    EKG EKG Interpretation  Date/Time:  Monday July 13 2019 14:08:32 EDT Ventricular Rate:  77 PR Interval:    QRS Duration: 184 QT Interval:  456 QTC Calculation: 516 R Axis:   -72 Text Interpretation:  Electronic ventricular pacemaker similar to previous Confirmed by Frederick Peers [id number]) on 07/13/2019 7:57:23 PM   Radiology Dg Chest 2 View  Result Date: 07/13/2019 CLINICAL DATA:  Shortness of breath since 07/09/2019. History of COPD. EXAM: CHEST - 2 VIEW COMPARISON:  CT chest 09/22/2018. Single-view of the chest 09/22/2018. PA and lateral chest 03/24/2018. FINDINGS: The lungs are emphysematous. Minimal scar atelectasis in the periphery of the right lung base noted. No consolidative  process or pneumothorax. Cardiomegaly is seen. Atherosclerosis. Pacing device in place. IMPRESSION: No acute disease. Emphysema. Cardiomegaly. Atherosclerosis. Electronically Signed   By: Drusilla Kanner M.D.   On: 07/13/2019 15:21    Procedures Procedures (including critical care time)  Medications Ordered in ED Medications - No data to display   Initial Impression / Assessment and Plan / ED Course  I have reviewed the triage vital signs and the nursing notes.  Pertinent labs & imaging results that were available during my care of the patient were reviewed by me and considered in my medical decision making (see chart for details).        Pt comfortable and alert on exam, mildly hypertensive, O2 saturation mid 90s on room air. DDX is broad and includes ACS, infection, metabolic derangement. Labs show negative trop, BNP 148, reassuring CMP and CBC. UA normal. CXR negative acute. I have ordered repeat trop and d-dimer to evaluate for PE. I am signing out to oncoming provider pending labs and reassessment.   Final Clinical Impressions(s) / ED Diagnoses   Final diagnoses:  None    ED Discharge Orders    None       Geneive Sandstrom, Ambrose Finland, MD 07/14/19 0006

## 2019-07-13 NOTE — ED Triage Notes (Signed)
Pt with c/o of shortness of breath since Thursday, she reports chest tightness w/o radiation. She saw her PCP today and was told to come here for blood test. No distress, lung sounds clear, denies cough. Hx of COPD.

## 2019-07-14 ENCOUNTER — Emergency Department (HOSPITAL_COMMUNITY): Payer: Medicare Other

## 2019-07-14 LAB — SARS CORONAVIRUS 2 (TAT 6-24 HRS): SARS Coronavirus 2: NEGATIVE

## 2019-07-14 LAB — D-DIMER, QUANTITATIVE: D-Dimer, Quant: 1.08 ug/mL-FEU — ABNORMAL HIGH (ref 0.00–0.50)

## 2019-07-14 LAB — TROPONIN I (HIGH SENSITIVITY): Troponin I (High Sensitivity): 8 ng/L (ref <18)

## 2019-07-14 MED ORDER — IOHEXOL 350 MG/ML SOLN
75.0000 mL | Freq: Once | INTRAVENOUS | Status: AC | PRN
  Administered 2019-07-14: 75 mL via INTRAVENOUS

## 2019-07-14 NOTE — ED Provider Notes (Signed)
I assumed care of this patient from Dr. Rex Kras.  Please see their note for further details of Hx, PE.  Briefly patient is a 85 y.o. female who presented with fatigue, chest discomfort and SOB. CP was atypical pending delta trop. Awaiting dimer.  Also awaiting UA.  ua and trop negative.  Dimer elevated. CTA PE negative.  OP COVID sent.  The patient is safe for discharge with strict return precautions.   The patient appears reasonably screened and/or stabilized for discharge and I doubt any other medical condition or other [facility] requiring further screening, evaluation, or treatment in the ED at this time prior to discharge.  Disposition: Discharge  Condition: Good  I have discussed the results, Dx and Tx plan with the patient and daughter who expressed understanding and agree(s) with the plan. Discharge instructions discussed at great length. The patient and daughter were given strict return precautions who verbalized understanding of the instructions. No further questions at time of discharge.    ED Discharge Orders    None       Follow Up: [name], MD [place] [address] [phone]  Schedule an appointment as soon as possible for a visit  As needed      Louvinia Cumbo, Grayce Sessions, MD 07/14/19 [phone]

## 2019-07-23 DIAGNOSIS — J9691 Respiratory failure, unspecified with hypoxia: Secondary | ICD-10-CM | POA: Diagnosis not present

## 2019-07-23 DIAGNOSIS — I340 Nonrheumatic mitral (valve) insufficiency: Secondary | ICD-10-CM | POA: Diagnosis not present

## 2019-07-23 DIAGNOSIS — J441 Chronic obstructive pulmonary disease with (acute) exacerbation: Secondary | ICD-10-CM | POA: Diagnosis not present

## 2019-07-23 DIAGNOSIS — I361 Nonrheumatic tricuspid (valve) insufficiency: Secondary | ICD-10-CM | POA: Diagnosis not present

## 2019-07-24 DIAGNOSIS — J441 Chronic obstructive pulmonary disease with (acute) exacerbation: Secondary | ICD-10-CM | POA: Diagnosis not present

## 2019-07-24 DIAGNOSIS — J9691 Respiratory failure, unspecified with hypoxia: Secondary | ICD-10-CM | POA: Diagnosis not present

## 2019-07-25 DIAGNOSIS — J441 Chronic obstructive pulmonary disease with (acute) exacerbation: Secondary | ICD-10-CM | POA: Diagnosis not present

## 2019-07-25 DIAGNOSIS — J9691 Respiratory failure, unspecified with hypoxia: Secondary | ICD-10-CM | POA: Diagnosis not present

## 2019-07-28 NOTE — Progress Notes (Signed)
Cardiology Office Note:    Date:  07/30/2019   ID:  Kimberly Raymond, DOB Mar 08, 1934, MRN [id number]  PCP:  Gordan Payment., MD  Cardiologist:  Norman Herrlich, MD    Referring MD: Gordan Payment., MD    ASSESSMENT:    1. Chronic atrial fibrillation (HCC)   2. Pacemaker   3. Hypertensive heart disease with heart failure (HCC)   4. Chronic obstructive pulmonary disease, unspecified COPD type (HCC)    PLAN:    In order of problems listed above:  1. Stable rate is controlled after AV nodal ablation she is pacemaker dependent followed in our practice and has projected 31-month battery life continue following pacemaker clinic does not require suppressant treatment as she is pacemaker dependent and has elected not to be anticoagulated.  She is aware of her stroke risk. 2. Stable hypertension continue treatment including her diuretic with diastolic heart failure that is compensated 3. Severe COPD she appears back to her previous baseline after recent hospitalization with acute decompensation   Next appointment: 6 months   Medication Adjustments/Labs and Tests Ordered: Current medicines are reviewed at length with the patient today.  Concerns regarding medicines are outlined above.  No orders of the defined types were placed in this encounter.  No orders of the defined types were placed in this encounter.   No chief complaint on file.   History of Present Illness:    Kimberly Raymond is a 85 y.o. female with a hx of COPD, CHF,chronic Atrial Fibrillation with AV nodal ablation for rate control with AVN ablation, Hypertension, non cardiac Chest Pain, and a pacemaker  last seen 01/02/2019.   Ref Range & Units 46mo ago  NT-Pro BNP 0 - 738 pg/mL 621   Compliance with diet, lifestyle and medications: Yes  She is recovered from her hospitalization there is no clear-cut precipitant she did have upper respiratory infection wheezing or venous thromboembolism.  She attributes to the stress of  having a water leak in her home and a dehumidifier may be correct.  No awareness of atrial fibrillation edema orthopnea chest pain palpitation or syncope but is severely limited from COPD which shortness of breath even with ADLs.  She continues not to be anticoagulated at her request  She had a recent [facility] brief admission 07/23/2019 to 07/25/2019 with acute exacerbation of COPD and respiratory failure with hypoxia and hypercapnia.  I reviewed those records prior to this visit.  While in the hospital an echocardiogram was performed showing an ejection fraction of 60 to 65% severe left atrial enlargement pacemaker wire in the right atrium moderate mitral and mild tricuspid regurgitation.  Pulmonary artery systolic pressure was normal chest CTA was performed with changes of emphysema and no evidence of pulmonary embolism.  EKG personally reviewed shows to be 100% ventricularly paced rhythm after AV nodal ablation.  Prior to discharge hemoglobin was 11.3 platelets 176,000 creatinine 0.40 potassium 4.1.  Troponin was checked x3 all were normal  2 coronavirus was negative and a proBNP level was moderately elevated 1010 consistent with atrial fibrillation.  She had no cardiac complications during that stay.  Her last pacemaker check 05/12/2019 shows normal function she is RV paced 100% of the time after AV nodal ablation and projected battery life was 2-1/2 years. Past Medical History:  Diagnosis Date   Age-related osteoporosis without current pathological fracture 05/18/2011   Last Assessment & Plan:  Relevant Hx: Course: Daily Update: Today's Plan:she is following with Dr. Albertha Ghee  for this and recieves prolia twice a year and is due for this in May  Electronically signed by: Krystal ClarkMelissa Joyce Brown-Patram, NP 02/16/16 2101   Allergic rhinitis    Amblyopia, right eye 11/08/2015   Arthritis 05/18/2011   Branch retinal vein occlusion of left eye 11/08/2015   Cardiac pacemaker in situ 05/18/2011     Cerebral thrombosis with cerebral infarction (HCC) 05/18/2011   Chronic atrial fibrillation (HCC) 11/22/2015   Overview:  With His ablation and permanent pacemaker for rate control, CHADS2 vasc score= 7  Overview:  Overview:  With His ablation and permanent pacemaker for rate control, CHADS2 vasc score= 7  Last Assessment & Plan:  Relevant Hx: Course: Daily Update: Today's Plan:she has pacemaker and is no longer taking eliquis   Electronically signed by: Krystal ClarkMelissa Joyce Brown-Patram, NP 02/16/16 2059   Chronic diastolic heart failure (HCC) 11/22/2015   Chronic edema 04/01/2017   Chronic obstructive pulmonary disease (HCC) 10/18/2016   Contracture of finger joint, left 10/30/2016   COPD (chronic obstructive pulmonary disease) (HCC)    Costochondral chest pain 11/22/2015   Dupuytren's contracture of left hand 10/30/2016   Epiretinal membrane (ERM) of left eye 11/08/2015   Essential hypertension 02/01/2016   Last Assessment & Plan:  Relevant Hx: Course: Daily Update: Today's Plan:this is fair for her right now and with her being sick it is not going to be ideal right now will follow  Electronically signed by: Krystal ClarkMelissa Joyce Brown-Patram, NP 02/16/16 2057   Essential tremor 05/18/2011   Hypertensive heart disease with heart failure (HCC) 11/22/2015   Hypertensive retinopathy of both eyes 11/08/2015   Last Assessment & Plan:  Relevant Hx: Course: Daily Update: Today's Plan:she is following with specialist for this currently  Electronically signed by: Krystal ClarkMelissa Joyce Brown-Patram, NP 02/16/16 2059   Laryngopharyngeal reflux    Left epiretinal membrane 06/26/2016   Mixed hyperlipidemia 02/01/2016   Last Assessment & Plan:  Relevant Hx: Course: Daily Update: Today's Plan:there is going to be fasting lipids done   Electronically signed by: Krystal ClarkMelissa Joyce Brown-Patram, NP 02/16/16 2058   Nocturnal oxygen desaturation    Non-rheumatic mitral regurgitation 01/08/2016   Overview:  Mild   Osteoporosis  05/18/2011   Posterior vitreous detachment of both eyes 06/26/2016   Presence of cardiac pacemaker 05/18/2011   Pseudophakia of both eyes 11/08/2015   PVD (posterior vitreous detachment), both eyes 11/08/2015   Retinal hemorrhage of both eyes 06/26/2016   S/P revision of total knee 08/10/2013   S/P total knee arthroplasty 12/29/2013    Past Surgical History:  Procedure Laterality Date   ABDOMINAL HYSTERECTOMY     BACK SURGERY     FEMUR FRACTURE SURGERY Right    FINGER SURGERY Right    5 finger   PACEMAKER INSERTION     PR PART PALMAR FASCIEC,OPEN 1 DIGIT Left 10/30/2016      REPLACEMENT TOTAL KNEE Right    REVISION TOTAL KNEE ARTHROPLASTY Right    SKIN GRAFT     TONSILLECTOMY     TUBAL LIGATION      Current Medications: Current Meds  Medication Sig   albuterol (PROVENTIL HFA;VENTOLIN HFA) 108 (90 BASE) MCG/ACT inhaler Inhale 2 puffs into the lungs every 4 (four) hours as needed for wheezing or shortness of breath.   Ascorbic Acid (VITAMIN C) 1000 MG tablet Take 1 tablet by mouth daily.   Biotin 1 MG CAPS Take 1 capsule by mouth daily.   Cholecalciferol (VITAMIN D3) 2000 units capsule Take 2 capsules  by mouth daily.    CRANBERRY EXTRACT PO Take 1 tablet by mouth daily.   cyanocobalamin (,VITAMIN B-12,) 1000 MCG/ML injection Inject 1,000 mcg into the muscle every 14 (fourteen) days.    denosumab (PROLIA) 60 MG/ML SOLN injection Inject 60 mg into the skin every 6 (six) months.   Dextran 70-Hypromellose (ARTIFICIAL TEARS PF OP) Place 1 drop into both eyes 3 (three) times daily.   furosemide (LASIX) 20 MG tablet Take 20 mg by mouth 3 (three) times a week. Takes on Tues, Thurs, and Saturday unless weight has increased by 3 lbs   Garlic 1610 MG CAPS Take 1,500 mg by mouth daily.   isosorbide mononitrate (IMDUR) 30 MG 24 hr tablet Take 15 mg by mouth daily.    Multiple Vitamin (MULTIVITAMIN) capsule Take 1 capsule by mouth daily.   primidone (MYSOLINE) 250 MG  tablet Take 1 tablet by mouth 2 (two) times daily.    Probiotic Product (PROBIOTIC DAILY PO) Take 1 tablet by mouth daily.   Red Yeast Rice Extract 600 MG CAPS Take 1 capsule by mouth daily.   traMADol (ULTRAM) 50 MG tablet Take 50 mg by mouth every 6 (six) hours as needed for moderate pain.    TURMERIC PO Take 1 tablet by mouth 2 (two) times daily.   umeclidinium-vilanterol (ANORO ELLIPTA) 62.5-25 MCG/INH AEPB Inhale 1 puff into the lungs daily.    vitamin E 1000 UNIT capsule Take 1 capsule by mouth daily.     Allergies:   Lidocaine hcl, Procaine, Propranolol, Codeine, Epinephrine, Gabapentin, Levofloxacin, Latex, and Sulfamethoxazole   Social History   Socioeconomic History   Marital status: Divorced    Spouse name: Not on file   Number of children: Not on file   Years of education: Not on file   Highest education level: Not on file  Occupational History   Not on file  Social Needs   Financial resource strain: Not on file   Food insecurity    Worry: Not on file    Inability: Not on file   Transportation needs    Medical: Not on file    Non-medical: Not on file  Tobacco Use   Smoking status: Former Smoker    Packs/day: 1.00    Years: 35.00    Pack years: 35.00    Types: Cigarettes    Quit date: 10/22/1985    Years since quitting: 33.7   Smokeless tobacco: Never Used  Substance and Sexual Activity   Alcohol use: No   Drug use: No   Sexual activity: Not on file  Lifestyle   Physical activity    Days per week: Not on file    Minutes per session: Not on file   Stress: Not on file  Relationships   Social connections    Talks on phone: Not on file    Gets together: Not on file    Attends religious service: Not on file    Active member of club or organization: Not on file    Attends meetings of clubs or organizations: Not on file    Relationship status: Not on file  Other Topics Concern   Not on file  Social History Narrative   Not on file       Family History: The patient's family history includes ALS in her mother; Stroke in her sister; Throat cancer in her sister; Ulcers in her father. ROS:   Please see the history of present illness.    All other systems reviewed and  are negative.  EKGs/Labs/Other Studies Reviewed:    The following studies were reviewed today:  EKG: I reviewed her hospital records including that EKG and did not repeat one today  Recent Labs: 01/02/2019: NT-Pro BNP 621 07/13/2019: ALT 20; B Natriuretic Peptide 148.8; BUN 8; Creatinine, Ser 0.58; Hemoglobin 12.3; Platelets 177; Potassium 4.1; Sodium 139  Recent Lipid Panel No results found for: CHOL, TRIG, HDL, CHOLHDL, VLDL, LDLCALC, LDLDIRECT  Physical Exam:    VS:  BP 116/76 (BP Location: Right Arm, Patient Position: Sitting, Cuff Size: Normal)    Pulse 79    Ht 5' 6.5" (1.689 m)    Wt 132 lb (59.9 kg)    SpO2 93%    BMI 20.99 kg/m     Wt Readings from Last 3 Encounters:  07/30/19 132 lb (59.9 kg)  01/02/19 126 lb 12.8 oz (57.5 kg)  08/27/18 131 lb (59.4 kg)     GEN: She appears frail but alert no respiratory distress with conversation she has a COPD appearance in no acute distress HEENT: Normal NECK: No JVD; No carotid bruits LYMPHATICS: No lymphadenopathy CARDIAC: Distant heart sounds S1 variable RRR, no murmurs, rubs, gallops RESPIRATORY: Breath sounds are diminished but no rales wheezing or rhonchi i ABDOMEN: Soft, non-tender, non-distended MUSCULOSKELETAL:  No edema; No deformity  SKIN: Warm and dry NEUROLOGIC:  Alert and oriented x 3 PSYCHIATRIC:  Normal affect    Signed, Norman Herrlich, MD  07/30/2019 11:47 AM    [facility]

## 2019-07-30 ENCOUNTER — Encounter: Payer: Self-pay | Admitting: Cardiology

## 2019-07-30 ENCOUNTER — Other Ambulatory Visit: Payer: Self-pay

## 2019-07-30 ENCOUNTER — Ambulatory Visit (INDEPENDENT_AMBULATORY_CARE_PROVIDER_SITE_OTHER): Payer: Medicare Other | Admitting: Cardiology

## 2019-07-30 VITALS — BP 116/76 | HR 79 | Ht 66.5 in | Wt 132.0 lb

## 2019-07-30 DIAGNOSIS — I110 Hypertensive heart disease with heart failure: Secondary | ICD-10-CM | POA: Diagnosis not present

## 2019-07-30 DIAGNOSIS — J449 Chronic obstructive pulmonary disease, unspecified: Secondary | ICD-10-CM

## 2019-07-30 DIAGNOSIS — I482 Chronic atrial fibrillation, unspecified: Secondary | ICD-10-CM

## 2019-07-30 DIAGNOSIS — Z950 Presence of cardiac pacemaker: Secondary | ICD-10-CM | POA: Diagnosis not present

## 2019-07-30 NOTE — Patient Instructions (Signed)

## 2019-08-10 ENCOUNTER — Ambulatory Visit (INDEPENDENT_AMBULATORY_CARE_PROVIDER_SITE_OTHER): Payer: Medicare Other | Admitting: *Deleted

## 2019-08-10 DIAGNOSIS — I482 Chronic atrial fibrillation, unspecified: Secondary | ICD-10-CM | POA: Diagnosis not present

## 2019-08-10 DIAGNOSIS — I5032 Chronic diastolic (congestive) heart failure: Secondary | ICD-10-CM

## 2019-08-11 LAB — CUP PACEART REMOTE DEVICE CHECK
Battery Impedance: 2545 Ohm
Battery Remaining Longevity: 26 mo
Battery Voltage: 2.74 V
Brady Statistic RV Percent Paced: 100 %
Date Time Interrogation Session: 20201020140855
Implantable Lead Implant Date: 20050802
Implantable Lead Location: 753860
Implantable Lead Model: 4076
Implantable Pulse Generator Implant Date: 20140717
Lead Channel Impedance Value: 0 Ohm
Lead Channel Impedance Value: 555 Ohm
Lead Channel Pacing Threshold Amplitude: 0.5 V
Lead Channel Pacing Threshold Pulse Width: 0.4 ms
Lead Channel Setting Pacing Amplitude: 2.5 V
Lead Channel Setting Pacing Pulse Width: 0.4 ms
Lead Channel Setting Sensing Sensitivity: 4 mV

## 2019-08-31 NOTE — Progress Notes (Signed)
Remote pacemaker transmission.   

## 2019-12-15 ENCOUNTER — Telehealth: Payer: Self-pay

## 2019-12-15 NOTE — Telephone Encounter (Signed)
Pt reports her remote monitor has stopped working.  She has spoken with [employer] support.  They are sending her a new monitor, should be receiving within 10 days.  Pt v/u to send a remote transmission once new monitor received.

## 2019-12-18 ENCOUNTER — Ambulatory Visit (INDEPENDENT_AMBULATORY_CARE_PROVIDER_SITE_OTHER): Payer: Medicare Other | Admitting: *Deleted

## 2019-12-18 DIAGNOSIS — Z950 Presence of cardiac pacemaker: Secondary | ICD-10-CM

## 2019-12-18 LAB — CUP PACEART REMOTE DEVICE CHECK
Battery Impedance: 2799 Ohm
Battery Remaining Longevity: 23 mo
Battery Voltage: 2.73 V
Brady Statistic RV Percent Paced: 100 %
Date Time Interrogation Session: 20210226100158
Implantable Lead Implant Date: 20050802
Implantable Lead Location: 753860
Implantable Lead Model: 4076
Implantable Pulse Generator Implant Date: 20140717
Lead Channel Impedance Value: 0 Ohm
Lead Channel Impedance Value: 559 Ohm
Lead Channel Pacing Threshold Amplitude: 0.625 V
Lead Channel Pacing Threshold Pulse Width: 0.4 ms
Lead Channel Setting Pacing Amplitude: 2.5 V
Lead Channel Setting Pacing Pulse Width: 0.4 ms
Lead Channel Setting Sensing Sensitivity: 4 mV

## 2019-12-18 NOTE — Progress Notes (Signed)
PPM remote 

## 2020-01-02 ENCOUNTER — Telehealth: Payer: Self-pay | Admitting: Cardiology

## 2020-01-02 NOTE — Telephone Encounter (Signed)
Received call from patient stating that she has felt poorly for the last severely days. She states that she has had mild difficulty with breathing and reports that she has been monitoring her O2 saturations which will dip to the 80's at times. She denies fever, chills, has had no cough, no LE or abdominal distention or orthopnea symptoms. Last remote PPM interrogation 12/18/2019 with normal device functioning. I have recommended that she go to the ED for further evaluation if she feels that her breathing and/or PPM is compromised. She is reluctant however willing to go given no availability to be seen over the weekend in the office and no way to fully assess the patient over the telephone. ED precautions reviewed.   Georgie Chard NP-C [facility] Pager: [phone]

## 2020-01-25 ENCOUNTER — Other Ambulatory Visit: Payer: Self-pay
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
## 2020-01-26 ENCOUNTER — Encounter: Payer: Self-pay | Admitting: Cardiology

## 2020-01-26 ENCOUNTER — Ambulatory Visit (INDEPENDENT_AMBULATORY_CARE_PROVIDER_SITE_OTHER): Payer: Medicare Other | Admitting: Cardiology

## 2020-01-26 ENCOUNTER — Other Ambulatory Visit: Payer: Self-pay

## 2020-01-26 ENCOUNTER — Telehealth: Payer: Self-pay | Admitting: Cardiology

## 2020-01-26 VITALS — BP 124/80 | HR 80 | Temp 97.0°F | Ht 66.0 in | Wt 132.4 lb

## 2020-01-26 DIAGNOSIS — I110 Hypertensive heart disease with heart failure: Secondary | ICD-10-CM | POA: Diagnosis not present

## 2020-01-26 DIAGNOSIS — I482 Chronic atrial fibrillation, unspecified: Secondary | ICD-10-CM | POA: Diagnosis not present

## 2020-01-26 DIAGNOSIS — R079 Chest pain, unspecified: Secondary | ICD-10-CM | POA: Diagnosis not present

## 2020-01-26 DIAGNOSIS — Z950 Presence of cardiac pacemaker: Secondary | ICD-10-CM | POA: Diagnosis not present

## 2020-01-26 DIAGNOSIS — J449 Chronic obstructive pulmonary disease, unspecified: Secondary | ICD-10-CM

## 2020-01-26 MED ORDER — FUROSEMIDE 20 MG PO TABS: 20.0000 mg | ORAL_TABLET | Freq: Every day | ORAL | 3 refills | Status: DC

## 2020-01-26 NOTE — Telephone Encounter (Signed)
Patient would like for her daughter to come with her to her appt today at 11am.  She states she has a bad knee and requires assistance in walking.

## 2020-01-26 NOTE — Patient Instructions (Addendum)
Medication Instructions:  Your physician has recommended you make the following change in your medication:  CHANGE: Take furosemide 20mg  one tablet daily.  *If you need a refill on your cardiac medications before your next appointment, please call your pharmacy*   Lab Work: Your physician recommends that you return for lab work in: TODAY Troponin If you have labs (blood work) drawn today and your tests are completely normal, you will receive your results only by: MyChart Message (if you have MyChart) OR . A paper copy in the mail If you have any lab test that is abnormal or we need to change your treatment, we will call you to review the results.   Testing/Procedures: Your physician has requested that you have an echocardiogram. Echocardiography is a painless test that uses sound waves to create images of your heart. It provides your doctor with information about the size and shape of your heart and how well your heart's chambers and valves are working. This procedure takes approximately one hour. There are no restrictions for this procedure.     Follow-Up: At [facility], you and your health needs are our priority.  As part of our continuing mission to provide you with exceptional heart care, we have created designated Provider Care Teams.  These Care Teams include your primary Cardiologist (physician) and Advanced Practice Providers (APPs -  Physician Assistants and Nurse Practitioners) who all work together to provide you with the care you need, when you need it.  We recommend signing up for the patient portal called "MyChart".  Sign up information is provided on this After Visit Summary.  MyChart is used to connect with patients for Virtual Visits (Telemedicine).  Patients are able to view lab/test results, encounter notes, upcoming appointments, etc.  Non-urgent messages can be sent to your provider as well.   To learn more about what you can do with MyChart, go to  [facility].    Your next appointment:   1 month(s)  The format for your next appointment:   In Person  Provider:   [email or web address], MD   Other Instructions Take aleve OTC daily for two weeks.

## 2020-01-26 NOTE — Telephone Encounter (Signed)
I spoke to the patient and told her that her daughter may accompany her to the appointment today.  She verbalized understanding.

## 2020-01-27 LAB — TROPONIN T: Troponin T (Highly Sensitive): <6 ng/L (ref 0–14)

## 2020-02-03 ENCOUNTER — Other Ambulatory Visit: Payer: Medicare Other

## 2020-02-04 ENCOUNTER — Other Ambulatory Visit: Payer: Self-pay

## 2020-02-04 ENCOUNTER — Ambulatory Visit (INDEPENDENT_AMBULATORY_CARE_PROVIDER_SITE_OTHER): Payer: Medicare Other

## 2020-02-04 DIAGNOSIS — R079 Chest pain, unspecified: Secondary | ICD-10-CM

## 2020-02-05 ENCOUNTER — Telehealth: Payer: Self-pay

## 2020-02-05 NOTE — Telephone Encounter (Signed)
-----   Message from Baldo Daub, MD sent at 02/05/2020  9:36 AM EDT ----- Normal or stable result  Mildly reduced function not uncommon in atrial fibrillation and I do not think she needs additional medications beyond furosemide

## 2020-02-05 NOTE — Telephone Encounter (Signed)
Spoke with patient regarding results and recommendation.  Patient verbalizes understanding and is agreeable to plan of care. Advised patient to call back with any issues or concerns.  

## 2020-02-24 ENCOUNTER — Other Ambulatory Visit: Payer: Self-pay

## 2020-02-25 ENCOUNTER — Encounter: Payer: Self-pay | Admitting: Cardiology

## 2020-02-25 ENCOUNTER — Ambulatory Visit (INDEPENDENT_AMBULATORY_CARE_PROVIDER_SITE_OTHER): Payer: Medicare Other | Admitting: Cardiology

## 2020-02-25 ENCOUNTER — Other Ambulatory Visit: Payer: Self-pay

## 2020-02-25 VITALS — BP 124/70 | HR 78 | Temp 97.4°F | Ht 66.0 in | Wt 134.4 lb

## 2020-02-25 DIAGNOSIS — R079 Chest pain, unspecified: Secondary | ICD-10-CM | POA: Diagnosis not present

## 2020-02-25 DIAGNOSIS — J449 Chronic obstructive pulmonary disease, unspecified: Secondary | ICD-10-CM

## 2020-02-25 DIAGNOSIS — Z950 Presence of cardiac pacemaker: Secondary | ICD-10-CM | POA: Diagnosis not present

## 2020-02-25 DIAGNOSIS — I110 Hypertensive heart disease with heart failure: Secondary | ICD-10-CM

## 2020-02-25 DIAGNOSIS — I482 Chronic atrial fibrillation, unspecified: Secondary | ICD-10-CM

## 2020-02-25 DIAGNOSIS — R54 Age-related physical debility: Secondary | ICD-10-CM

## 2020-02-25 NOTE — Patient Instructions (Signed)
Medication Instructions:  Your physician recommends that you continue on your current medications as directed. Please refer to the Current Medication list given to you today.  Take OTC Aleve daily as needed.  *If you need a refill on your cardiac medications before your next appointment, please call your pharmacy*   Lab Work: None If you have labs (blood work) drawn today and your tests are completely normal, you will receive your results only by: Marland Kitchen MyChart Message (if you have MyChart) OR . A paper copy in the mail If you have any lab test that is abnormal or we need to change your treatment, we will call you to review the results.   Testing/Procedures: None   Follow-Up: At [facility], you and your health needs are our priority.  As part of our continuing mission to provide you with exceptional heart care, we have created designated Provider Care Teams.  These Care Teams include your primary Cardiologist (physician) and Advanced Practice Providers (APPs -  Physician Assistants and Nurse Practitioners) who all work together to provide you with the care you need, when you need it.  We recommend signing up for the patient portal called "MyChart".  Sign up information is provided on this After Visit Summary.  MyChart is used to connect with patients for Virtual Visits (Telemedicine).  Patients are able to view lab/test results, encounter notes, upcoming appointments, etc.  Non-urgent messages can be sent to your provider as well.   To learn more about what you can do with MyChart, go to [email or web address].    Your next appointment:   6 month(s)  The format for your next appointment:   In Person  Provider:   Norman Herrlich, MD   Other Instructions
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
## 2020-03-18 ENCOUNTER — Ambulatory Visit (INDEPENDENT_AMBULATORY_CARE_PROVIDER_SITE_OTHER): Payer: Medicare Other | Admitting: *Deleted

## 2020-03-18 DIAGNOSIS — I482 Chronic atrial fibrillation, unspecified: Secondary | ICD-10-CM | POA: Diagnosis not present

## 2020-03-18 LAB — CUP PACEART REMOTE DEVICE CHECK
Battery Impedance: 3006 Ohm
Battery Remaining Longevity: 21 mo
Battery Voltage: 2.73 V
Brady Statistic RV Percent Paced: 100 %
Date Time Interrogation Session: 20210528100726
Implantable Lead Implant Date: 20050802
Implantable Lead Location: 753860
Implantable Lead Model: 4076
Implantable Pulse Generator Implant Date: 20140717
Lead Channel Impedance Value: 0 Ohm
Lead Channel Impedance Value: 554 Ohm
Lead Channel Pacing Threshold Amplitude: 0.625 V
Lead Channel Pacing Threshold Pulse Width: 0.4 ms
Lead Channel Setting Pacing Amplitude: 2.5 V
Lead Channel Setting Pacing Pulse Width: 0.4 ms
Lead Channel Setting Sensing Sensitivity: 4 mV

## 2020-03-18 NOTE — Progress Notes (Signed)
Remote pacemaker transmission.   

## 2020-04-15 ENCOUNTER — Ambulatory Visit (INDEPENDENT_AMBULATORY_CARE_PROVIDER_SITE_OTHER): Payer: Medicare Other | Admitting: Sports Medicine

## 2020-04-15 ENCOUNTER — Other Ambulatory Visit: Payer: Self-pay

## 2020-04-15 ENCOUNTER — Other Ambulatory Visit: Payer: Self-pay | Admitting: Sports Medicine

## 2020-04-15 ENCOUNTER — Encounter: Payer: Self-pay | Admitting: Sports Medicine

## 2020-04-15 DIAGNOSIS — I739 Peripheral vascular disease, unspecified: Secondary | ICD-10-CM

## 2020-04-15 DIAGNOSIS — M21621 Bunionette of right foot: Secondary | ICD-10-CM | POA: Diagnosis not present

## 2020-04-15 DIAGNOSIS — M792 Neuralgia and neuritis, unspecified: Secondary | ICD-10-CM

## 2020-04-15 DIAGNOSIS — M79671 Pain in right foot: Secondary | ICD-10-CM

## 2020-04-15 NOTE — Progress Notes (Signed)
Subjective: Kimberly Raymond is a 86 y.o. female patient who returns to office for follow-up evaluation of right plantar and lateral foot pain. Patient reports that there is a weird wadded up sock sensation that he she has had on the bottom of the right foot since her knee surgery in 2011 reports that there is some discomfort also there is some pain to the lateral foot when she is lying in bed on this area reports that she had an x-ray a few months ago that was negative after she dropped her phone on the top of the foot that likely showed a contusion to the top but since this pain has resolved no other acute injury noted.  Denies injury/trip/fall/sprain/any other causative factors for plantar foot or lateral foot pain.   Patient is assisted by daughter this visit.  Patient Active Problem List   Diagnosis Date Noted  . Extensor tendonitis of foot 12/25/2018  . Acute bronchitis, complicated 09/11/2018  . Chronic rhinitis 05/22/2018  . Iron deficiency 05/22/2018  . Chronic pansinusitis 05/22/2018  . Other dietary vitamin B12 deficiency anemia 04/15/2018  . Pressure injury of skin of dorsum of right foot 04/10/2018  . Coronary artery calcification seen on CAT scan 04/01/2018  . Coronary artery disease involving native coronary artery of native heart with angina pectoris (HCC) 04/01/2018  . Supplemental oxygen dependent 03/21/2018  . Chronic respiratory failure with hypoxia (HCC) 03/06/2018  . Aortic atherosclerosis (HCC) 02/17/2018  . Shortness of breath 12/30/2017  . Rib pain on right side 12/27/2017  . Gait abnormality 08/15/2017  . Multiple falls 08/15/2017  . Chronic bilateral back pain 08/14/2017  . High risk medication use 05/30/2017  . Chronic edema 04/01/2017  . Contracture of finger joint, left 10/30/2016  . Dupuytren's contracture of left hand 10/30/2016  . Chronic obstructive pulmonary disease (HCC) 10/18/2016  . Left epiretinal membrane 06/26/2016  . Posterior vitreous detachment  of both eyes 06/26/2016  . Retinal hemorrhage of both eyes 06/26/2016  . Dizziness 05/22/2016  . History of dizziness 05/22/2016  . Mixed hyperlipidemia 02/01/2016  . Non-rheumatic mitral regurgitation 01/08/2016  . Chronic atrial fibrillation (HCC) 11/22/2015  . Chronic diastolic heart failure (HCC) 11/22/2015  . Costochondral chest pain 11/22/2015  . Hypertensive heart disease with heart failure (HCC) 11/22/2015  . Amblyopia, right eye 11/08/2015  . Branch retinal vein occlusion of left eye 11/08/2015  . Epiretinal membrane (ERM) of left eye 11/08/2015  . Hypertensive retinopathy of both eyes 11/08/2015  . Pseudophakia of both eyes 11/08/2015  . PVD (posterior vitreous detachment), both eyes 11/08/2015  . Left foot pain 04/08/2014  . S/P total knee arthroplasty 12/29/2013  . S/P revision of total knee 08/10/2013  . Age-related osteoporosis without current pathological fracture 05/18/2011  . Arthritis 05/18/2011  . Cardiac pacemaker in situ 05/18/2011  . Cerebral thrombosis with cerebral infarction (HCC) 05/18/2011  . Essential tremor 05/18/2011  . Osteoporosis 05/18/2011  . Pacemaker 05/18/2011    Current Outpatient Medications on File Prior to Visit  Medication Sig Dispense Refill  . albuterol (PROVENTIL HFA;VENTOLIN HFA) 108 (90 BASE) MCG/ACT inhaler Inhale 2 puffs into the lungs every 4 (four) hours as needed for wheezing or shortness of breath.    . Ascorbic Acid (VITAMIN C) 1000 MG tablet Take 1 tablet by mouth daily.    . Biotin 1 MG CAPS Take 1 capsule by mouth daily.    . Cholecalciferol (VITAMIN D3) 2000 units capsule Take 2 capsules by mouth daily.     Marland Kitchen  CRANBERRY EXTRACT PO Take 1 tablet by mouth daily.    . cyanocobalamin (,VITAMIN B-12,) 1000 MCG/ML injection Inject 1,000 mcg into the muscle every 14 (fourteen) days.     Marland Kitchen denosumab (PROLIA) 60 MG/ML SOLN injection Inject 60 mg into the skin every 6 (six) months.    . Dextran 70-Hypromellose (ARTIFICIAL TEARS PF  OP) Place 1 drop into both eyes 3 (three) times daily.    . famotidine (PEPCID) 20 MG tablet Take 20 mg by mouth 2 (two) times daily.    . fexofenadine-pseudoephedrine (ALLEGRA-D 24) 180-240 MG 24 hr tablet Take 1 tablet by mouth daily.    . furosemide (LASIX) 20 MG tablet Take 1 tablet (20 mg total) by mouth daily. 90 tablet 3  . Garlic 1500 MG CAPS Take 1,500 mg by mouth daily.    . isosorbide mononitrate (IMDUR) 30 MG 24 hr tablet Take 30 mg by mouth daily.    . Multiple Vitamin (MULTIVITAMIN) capsule Take 1 capsule by mouth daily.    . primidone (MYSOLINE) 250 MG tablet Take 250 mg by mouth in the morning and at bedtime.    . Probiotic Product (PROBIOTIC DAILY PO) Take 1 tablet by mouth daily.    . Red Yeast Rice Extract 600 MG CAPS Take 1 capsule by mouth daily.    . traMADol (ULTRAM) 50 MG tablet Take 50 mg by mouth every 6 (six) hours as needed for moderate pain.     . TURMERIC PO Take 1 tablet by mouth 2 (two) times daily.    Marland Kitchen umeclidinium-vilanterol (ANORO ELLIPTA) 62.5-25 MCG/INH AEPB Inhale 1 puff into the lungs daily.    . vitamin E 1000 UNIT capsule Take 1 capsule by mouth daily.     No current facility-administered medications on file prior to visit.    Allergies  Allergen Reactions  . Lidocaine Hcl Shortness Of Breath  . Procaine Shortness Of Breath    "in a dentist office" pt reports  . Propranolol Shortness Of Breath  . Codeine     Hallucinations  . Epinephrine     Other reaction(s): Increased Heart Rate (intolerance)  . Gabapentin Other (See Comments)    drowsiness  . Levofloxacin     Other reaction(s): Malaise (intolerance)  . Other Other (See Comments)    drowsiness  . Latex Rash  . Sulfamethoxazole Rash    Blisters in mouth    Objective:  General: Alert and oriented x3 in no acute distress  Dermatology: No open lesions bilateral lower extremities, no webspace macerations, no ecchymosis bilateral, all nails x 10 are well manicured.  Vascular: Dorsalis  Pedis and Posterior Tibial pedal pulses nonpalpable, Capillary Fill Time 5 seconds, no pedal hair growth bilateral, trace edema bilateral lower extremities, Temperature gradient decreased bilateral with significant varicosities and purple and brown discoloration to both feet consistent with PVD.  Neurology: Michaell Cowing sensation intact via light touch bilateral, protective sensation diminished right greater than left with bunched up sock feeling on the right plantar forefoot unchanged from prior present since 2011.  Musculoskeletal: Mild tenderness with palpation at right plantar forefoot and prominent styloid process and fifth metatarsal head with tailor's bunion deformity on right, no pain to dorsal midfoot on right at area of previous injury dropping fall onto the foot, strength within normal limits in all groups bilateral.  Hammertoe and pes planus deformity.  Assessment and Plan: Problem List Items Addressed This Visit    None    Visit Diagnoses    Neuritis    -  Primary   Right foot pain       Tailor's bunion of right foot       PVD (peripheral vascular disease) ([place])           -Complete examination performed -Discussed treatment options for right foot pain likely secondary to neuritis after knee surgery and tailor's bunion deformity that is aggravated by bed positioning also with history of back problems as patient told me when she was getting ready to check out -Advised patient that neuritis likely will not improve since it is has been present since 2011 recommend topical pain cream or rub or soaking as needed and advised patient that neuropathy medications Have potential side effects that could not be tolerable hence her history of allergy to gabapentin in the past -Dispense moleskin for patient to use at lateral foot on right to prevent rubbing and advised patient to use a pillow when lying in bed to prevent overloading the area when sleeping -Patient to return to office as needed or sooner  if condition worsens.  Landis Martins, DPM

## 2020-05-27 ENCOUNTER — Telehealth: Payer: Self-pay | Admitting: Cardiology

## 2020-05-27 NOTE — Telephone Encounter (Signed)
I think we can get her in to see Dr. Dulce Sellar in the next week or 2.  In the meantime if her symptoms worsen advised her that she should be evaluated at the emergency department.

## 2020-05-27 NOTE — Telephone Encounter (Signed)
Lm to call back ./cy 

## 2020-05-27 NOTE — Telephone Encounter (Signed)
Pt c/o Shortness Of Breath: STAT if SOB developed within the last 24 hours or pt is noticeably SOB on the phone  1. Are you currently SOB (can you hear that pt is SOB on the phone)? no  2. How long have you been experiencing SOB? 3 weeks  3. Are you SOB when sitting or when up moving around? When walking  4. Are you currently experiencing any other symptoms? No   Patient states she has been having SOB for about 3 weeks when  Walking. She is scheduled 10/11 with Dr. Dulce Sellar and would like to know if she can be seen sooner.

## 2020-05-30 NOTE — Telephone Encounter (Signed)
Called patient and scheduled her for next available appointment with Dr. Dulce Sellar. Advised her to go to the emergency room if things change or get worse.

## 2020-06-01 DIAGNOSIS — M4726 Other spondylosis with radiculopathy, lumbar region: Secondary | ICD-10-CM

## 2020-06-01 DIAGNOSIS — M5416 Radiculopathy, lumbar region: Secondary | ICD-10-CM

## 2020-06-01 HISTORY — DX: Other spondylosis with radiculopathy, lumbar region: M47.26

## 2020-06-01 HISTORY — DX: Radiculopathy, lumbar region: M54.16

## 2020-06-06 ENCOUNTER — Ambulatory Visit (INDEPENDENT_AMBULATORY_CARE_PROVIDER_SITE_OTHER): Payer: Medicare Other | Admitting: Cardiology

## 2020-06-06 ENCOUNTER — Other Ambulatory Visit: Payer: Self-pay

## 2020-06-06 ENCOUNTER — Encounter: Payer: Self-pay | Admitting: Cardiology

## 2020-06-06 VITALS — BP 122/70 | HR 78 | Ht 66.0 in | Wt 131.0 lb

## 2020-06-06 DIAGNOSIS — Z950 Presence of cardiac pacemaker: Secondary | ICD-10-CM

## 2020-06-06 DIAGNOSIS — R0602 Shortness of breath: Secondary | ICD-10-CM | POA: Diagnosis not present

## 2020-06-06 DIAGNOSIS — I482 Chronic atrial fibrillation, unspecified: Secondary | ICD-10-CM

## 2020-06-06 LAB — CUP PACEART INCLINIC DEVICE CHECK
Battery Impedance: 3191 Ohm
Battery Remaining Longevity: 20 mo
Battery Voltage: 2.72 V
Brady Statistic RV Percent Paced: 100 %
Date Time Interrogation Session: 20210816121724
Implantable Lead Implant Date: 20050802
Implantable Lead Location: 753860
Implantable Lead Model: 4076
Implantable Pulse Generator Implant Date: 20140717
Lead Channel Impedance Value: 0 Ohm
Lead Channel Impedance Value: 582 Ohm
Lead Channel Pacing Threshold Amplitude: 0.75 V
Lead Channel Pacing Threshold Pulse Width: 0.4 ms
Lead Channel Sensing Intrinsic Amplitude: 11.2 mV
Lead Channel Setting Pacing Amplitude: 2.5 V
Lead Channel Setting Pacing Pulse Width: 0.4 ms
Lead Channel Setting Sensing Sensitivity: 4 mV

## 2020-06-06 NOTE — Progress Notes (Signed)
Electrophysiology Office Note   Date:  06/06/2020   ID:  Kimberly Raymond, DOB 12/12/1933, MRN [id number]  PCP:  Gordan PaymentGrisso, Greg A., MD  Cardiologist:  Kimberly Raymond Primary Electrophysiologist:  Kimberly Stmarie Jorja LoaMartin Spiro Ausborn, MD    Chief Complaint: pacemaker   History of Present Illness: Kimberly JubaCarol B Geter is a 86 y.o. female who is being seen today for the evaluation of pacemaker at the request of Gordan PaymentGrisso, Greg A., MD. Presenting today for electrophysiology evaluation.  She has a history significant for COPD, CHF, chronic atrial fibrillation status post AV node ablation and Medtronic pacemaker, hypertension.  Today, she denies symptoms of palpitations, chest pain, orthopnea, PND, lower extremity edema, claudication, dizziness, presyncope, syncope, bleeding, or neurologic sequela. The patient is tolerating medications without difficulties.  Her main complaint today is shortness of breath.  She was recently diagnosed with COPD and has been undergoing therapy for that.  She has been more short of breath, though, over the last 2 months.  She does not feel that she is retaining fluid, but she is RV paced.  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  She does not appear to be volume overloaded and says that her weight has remained stable.   Past Medical History:  Diagnosis Date  . Acute bronchitis, complicated 09/11/2018  . Age-related osteoporosis without current pathological fracture 05/18/2011   Last Assessment & Plan:  Relevant Hx: Course: Daily Update: Today's Plan:she is following with Dr. Albertha Gheeebecca Bassett for this and recieves prolia twice a year and is due for this in May  Electronically signed by: Krystal ClarkMelissa Joyce Brown-Patram, NP 02/16/16 2101  . Allergic rhinitis   . Amblyopia, right eye 11/08/2015  . Aortic atherosclerosis (HCC) 02/17/2018  . Arthritis 05/18/2011  . Branch retinal vein occlusion of left eye 11/08/2015  . Cardiac pacemaker in situ 05/18/2011  . Cerebral thrombosis with cerebral infarction  (HCC) 05/18/2011  . Chronic atrial fibrillation (HCC) 11/22/2015   Overview:  With His ablation and permanent pacemaker for rate control, CHADS2 vasc score= 7  Overview:  Overview:  With His ablation and permanent pacemaker for rate control, CHADS2 vasc score= 7  Last Assessment & Plan:  Relevant Hx: Course: Daily Update: Today's Plan:she has pacemaker and is no longer taking eliquis   Electronically signed by: Krystal ClarkMelissa Joyce Brown-Patram, NP 02/16/16 2059  . Chronic bilateral back pain 08/14/2017  . Chronic diastolic heart failure (HCC) 11/22/2015  . Chronic edema 04/01/2017  . Chronic obstructive pulmonary disease (HCC) 10/18/2016  . Chronic pansinusitis 05/22/2018  . Chronic respiratory failure with hypoxia (HCC) 03/06/2018  . Chronic rhinitis 05/22/2018  . Contracture of finger joint, left 10/30/2016  . COPD (chronic obstructive pulmonary disease) (HCC)   . Coronary artery calcification seen on CAT scan 04/01/2018   Seen on CT scan March 2019 with three-vessel calcification  . Coronary artery disease involving native coronary artery of native heart with angina pectoris (HCC) 04/01/2018   Formatting of this note might be different from the original. Seen on CT scan March 2019 with three-vessel calcification  . Costochondral chest pain 11/22/2015  . Dizziness 05/22/2016   Last Assessment & Plan:  She is going to have meclizine sent in for her and she is aware of the need to have safety due to sleepy effect  . Dupuytren's contracture of left hand 10/30/2016  . Epiretinal membrane (ERM) of left eye 11/08/2015  . Essential hypertension 02/01/2016   Last Assessment & Plan:  Relevant Hx: Course: Daily Update: Today's Plan:this is fair  for her right now and with her being sick it is not going to be ideal right now Tyrelle Raczka follow  Electronically signed by: [name], NP 02/16/16 2057  . Essential tremor 05/18/2011  . Extensor tendonitis of foot 12/25/2018  . Gait abnormality 08/15/2017  . High risk  medication use 05/30/2017  . History of dizziness 05/22/2016   Last Assessment & Plan:  She is going to have meclizine sent in for her and she is aware of the need to have safety due to sleepy effect  . Hypertensive heart disease with heart failure (HCC) 11/22/2015  . Hypertensive retinopathy of both eyes 11/08/2015   Last Assessment & Plan:  Relevant Hx: Course: Daily Update: Today's Plan:she is following with specialist for this currently  Electronically signed by: [name], NP 02/16/16 2059  . Iron deficiency 05/22/2018  . Laryngopharyngeal reflux   . Left epiretinal membrane 06/26/2016  . Left foot pain 04/08/2014  . Mixed hyperlipidemia 02/01/2016   Last Assessment & Plan:  Relevant Hx: Course: Daily Update: Today's Plan:there is going to be fasting lipids done   Electronically signed by: [name], NP 02/16/16 2058  . Multiple falls 08/15/2017  . Nocturnal oxygen desaturation   . Non-rheumatic mitral regurgitation 01/08/2016   Overview:  Mild  . Osteoporosis 05/18/2011  . Other dietary vitamin B12 deficiency anemia 04/15/2018  . Pacemaker 05/18/2011   Manufacturer of Device: Medtronic  Type of DeviceInsurance risk surveyor      . Posterior vitreous detachment of both eyes 06/26/2016  . Presence of cardiac pacemaker 05/18/2011  . Pressure injury of skin of dorsum of right foot 04/10/2018  . Pseudophakia of both eyes 11/08/2015  . PVD (posterior vitreous detachment), both eyes 11/08/2015  . Retinal hemorrhage of both eyes 06/26/2016  . Rib pain on right side 12/27/2017  . S/P revision of total knee 08/10/2013  . S/P total knee arthroplasty 12/29/2013  . Shortness of breath 12/30/2017  . Supplemental oxygen dependent 03/21/2018   Past Surgical History:  Procedure Laterality Date  . ABDOMINAL HYSTERECTOMY    . BACK SURGERY    . FEMUR FRACTURE SURGERY Right   . FINGER SURGERY Right    5 finger  . PACEMAKER INSERTION    . PR PART PALMAR FASCIEC,OPEN 1 DIGIT Left  10/30/2016     . REPLACEMENT TOTAL KNEE Right   . REVISION TOTAL KNEE ARTHROPLASTY Right   . SKIN GRAFT    . TONSILLECTOMY    . TUBAL LIGATION       Current Outpatient Medications  Medication Sig Dispense Refill  . albuterol (PROVENTIL HFA;VENTOLIN HFA) 108 (90 BASE) MCG/ACT inhaler Inhale 2 puffs into the lungs every 4 (four) hours as needed for wheezing or shortness of breath.    . Ascorbic Acid (VITAMIN C) 1000 MG tablet Take 1 tablet by mouth daily.    . Biotin 1 MG CAPS Take 1 capsule by mouth daily.    . Cholecalciferol (VITAMIN D3) 2000 units capsule Take 2 capsules by mouth daily.     Marland Kitchen CRANBERRY EXTRACT PO Take 1 tablet by mouth daily.    . cyanocobalamin (,VITAMIN B-12,) 1000 MCG/ML injection Inject 1,000 mcg into the muscle every 14 (fourteen) days.     Marland Kitchen denosumab (PROLIA) 60 MG/ML SOLN injection Inject 60 mg into the skin every 6 (six) months.    . Dextran 70-Hypromellose (ARTIFICIAL TEARS PF OP) Place 1 drop into both eyes 3 (three) times daily.    Marland Kitchen  famotidine (PEPCID) 20 MG tablet Take 20 mg by mouth 2 (two) times daily.    . fexofenadine-pseudoephedrine (ALLEGRA-D 24) 180-240 MG 24 hr tablet Take 1 tablet by mouth daily.    . furosemide (LASIX) 20 MG tablet Take 1 tablet (20 mg total) by mouth daily. 90 tablet 3  . Garlic 1500 MG CAPS Take 1,500 mg by mouth daily.    . isosorbide mononitrate (IMDUR) 30 MG 24 hr tablet Take 30 mg by mouth daily.    . Multiple Vitamin (MULTIVITAMIN) capsule Take 1 capsule by mouth daily.    . primidone (MYSOLINE) 250 MG tablet Take 250 mg by mouth in the morning and at bedtime.    . Probiotic Product (PROBIOTIC DAILY PO) Take 1 tablet by mouth daily.    . Red Yeast Rice Extract 600 MG CAPS Take 1 capsule by mouth daily.    . traMADol (ULTRAM) 50 MG tablet Take 50 mg by mouth every 6 (six) hours as needed for moderate pain.     . TURMERIC PO Take 1 tablet by mouth 2 (two) times daily.    Marland Kitchen umeclidinium-vilanterol (ANORO ELLIPTA) 62.5-25  MCG/INH AEPB Inhale 1 puff into the lungs daily.    . vitamin E 1000 UNIT capsule Take 1 capsule by mouth daily.     No current facility-administered medications for this visit.    Allergies:   Lidocaine hcl, Procaine, Propranolol, Codeine, Epinephrine, Gabapentin, Levofloxacin, Other, Latex, and Sulfamethoxazole   Social History:  The patient  reports that she quit smoking about 34 years ago. Her smoking use included cigarettes. She has a 35.00 pack-year smoking history. She has never used smokeless tobacco. She reports that she does not drink alcohol and does not use drugs.   Family History:  The patient's family history includes ALS in her mother; Stroke in her sister; Throat cancer in her sister; Ulcers in her father.    ROS:  Please see the history of present illness.   Otherwise, review of systems is positive for none.   All other systems are reviewed and negative.    PHYSICAL EXAM: VS:  BP 122/70   Pulse 78   Ht 5\' 6"  (1.676 m)   Wt 131 lb (59.4 kg)   SpO2 95%   BMI 21.14 kg/m  , BMI Body mass index is 21.14 kg/m. GEN: Well nourished, well developed, in no acute distress  HEENT: normal  Neck: no JVD, carotid bruits, or masses Cardiac: RRR; no murmurs, rubs, or gallops,no edema  Respiratory:  clear to auscultation bilaterally, normal work of breathing GI: soft, nontender, nondistended, + BS MS: no deformity or atrophy  Skin: warm and dry, device pocket is well healed Neuro:  Strength and sensation are intact Psych: euthymic mood, full affect  EKG:  EKG is ordered today. Personal review of the ekg ordered shows atrial fibrillation, ventricular paced  Device interrogation is reviewed today in detail.  See PaceArt for details.   Recent Labs: 07/13/2019: ALT 20; B Natriuretic Peptide 148.8; BUN 8; Creatinine, Ser 0.58; Hemoglobin 12.3; Platelets 177; Potassium 4.1; Sodium 139    Lipid Panel  No results found for: CHOL, TRIG, HDL, CHOLHDL, VLDL, LDLCALC,  LDLDIRECT   Wt Readings from Last 3 Encounters:  06/06/20 131 lb (59.4 kg)  02/25/20 134 lb 6.4 oz (61 kg)  01/26/20 132 lb 6.4 oz (60.1 kg)      Other studies Reviewed: Additional studies/ records that were reviewed today include: TTE 02/04/20  Review of the above records  today demonstrates:  1. Left ventricular ejection fraction, by estimation, is 45 to 50%. The  left ventricle has mildly decreased function. The left ventricle has no  regional wall motion abnormalities. There is mild concentric left  ventricular hypertrophy. Left ventricular  diastolic parameters are consistent with Grade I diastolic dysfunction  (impaired relaxation).  2. Right ventricular systolic function is mildly reduced. The right  ventricular size is moderately enlarged. Device lead seen in the right  ventricle.  3. Left atrial size was severely dilated.  4. Right atrial size was severely dilated. Device lead seen in the right  atrium.  5. The mitral valve is normal in structure. Mild to moderate mitral valve  regurgitation. No evidence of mitral stenosis.  6. Tricuspid valve regurgitation is moderate.  7. The aortic valve is tricuspid. Aortic valve regurgitation is not  visualized. No aortic stenosis is present.  8. Moderate Pulmonary Hypertension. Pulmonary artery systolic pressure  54.9 mmHG.  9. Right to left shunt noted by color dopper (clip 60 & 61).  10. The inferior vena cava is dilated in size with <50% respiratory  variability, suggesting right atrial pressure of 15 mmHg.    ASSESSMENT AND PLAN:  1.  Permanent atrial fibrillation: Status post AV node ablation.  Currently on Eliquis.  CHA2DS2-VASc of 4.  Device is functioning appropriately.  She does complain of some shortness of breath that is worse when she exerts herself.  She was recently diagnosed with COPD.  She feels that this is worse than her COPD as though.  We Herson Prichard get a BNP.  2.  Medtronic dual-chamber pacemaker:  Implanted in 2005.  Device functioning appropriately.  Is device dependent due to her AV node ablation.  3.  Hypertension: Currently well controlled    Current medicines are reviewed at length with the patient today.   The patient does not have concerns regarding her medicines.  The following changes were made today:  none  Labs/ tests ordered today include:  Orders Placed This Encounter  Procedures  . Pro b natriuretic peptide (BNP)  . CUP PACEART INCLINIC DEVICE CHECK  . EKG 12-Lead     Disposition:   FU with Sorin Frimpong 1 year  Signed, Damali Broadfoot [name], MD  06/06/2020 12:28 PM     [facility] [address] [place] [address] [phone] (office) [phone] (fax)

## 2020-06-06 NOTE — Patient Instructions (Signed)
Medication Instructions:  Your physician recommends that you continue on your current medications as directed. Please refer to the Current Medication list given to you today.  *If you need a refill on your cardiac medications before your next appointment, please call your pharmacy*   Lab Work: Today: BNP If you have labs (blood work) drawn today and your tests are completely normal, you will receive your results only by: Marland Kitchen MyChart Message (if you have MyChart) OR . A paper copy in the mail If you have any lab test that is abnormal or we need to change your treatment, we will call you to review the results.   Testing/Procedures: None ordered   Follow-Up: Remote monitoring is used to monitor your Pacemaker of ICD from home. This monitoring reduces the number of office visits required to check your device to one time per year. It allows [place] to keep an eye on the functioning of your device to ensure it is working properly. You are scheduled for a device check from home on 06/17/2020. You may send your transmission at any time that day. If you have a wireless device, the transmission will be sent automatically. After your physician reviews your transmission, you will receive a postcard with your next transmission date.  At [facility], you and your health needs are our priority.  As part of our continuing mission to provide you with exceptional heart care, we have created designated Provider Care Teams.  These Care Teams include your primary Cardiologist (physician) and Advanced Practice Providers (APPs -  Physician Assistants and Nurse Practitioners) who all work together to provide you with the care you need, when you need it.  We recommend signing up for the patient portal called "MyChart".  Sign up information is provided on this After Visit Summary.  MyChart is used to connect with patients for Virtual Visits (Telemedicine).  Patients are able to view lab/test results, encounter notes,  upcoming appointments, etc.  Non-urgent messages can be sent to your provider as well.   To learn more about what you can do with MyChart, go to [email or web address].    Your next appointment:   1 year(s)  The format for your next appointment:   In Person  Provider:   Loman Brooklyn, MD   Thank you for choosing [facility]!!   Dory Horn, RN [phone]    Other Instructions

## 2020-06-07 LAB — PRO B NATRIURETIC PEPTIDE: NT-Pro BNP: 799 pg/mL — ABNORMAL HIGH (ref 0–738)

## 2020-06-16 DIAGNOSIS — J449 Chronic obstructive pulmonary disease, unspecified: Secondary | ICD-10-CM | POA: Insufficient documentation

## 2020-06-16 DIAGNOSIS — K219 Gastro-esophageal reflux disease without esophagitis: Secondary | ICD-10-CM | POA: Insufficient documentation

## 2020-06-16 DIAGNOSIS — G4734 Idiopathic sleep related nonobstructive alveolar hypoventilation: Secondary | ICD-10-CM | POA: Insufficient documentation

## 2020-06-16 DIAGNOSIS — J309 Allergic rhinitis, unspecified: Secondary | ICD-10-CM | POA: Insufficient documentation

## 2020-06-16 NOTE — Progress Notes (Signed)
Cardiology Office Note:    Date:  06/17/2020   ID:  Kimberly JubaCarol B Sevcik, DOB 04/07/1934, MRN [id number]  PCP:  Gordan PaymentGrisso, Greg A., MD  Cardiologist:  Norman HerrlichBrian Nilsa Macht, MD    Referring MD: Gordan PaymentGrisso, Greg A., MD    ASSESSMENT:    1. Hypertensive heart disease with heart failure (HCC)   2. Chronic atrial fibrillation (HCC)   3. Cardiac pacemaker in situ   4. Chronic obstructive pulmonary disease, unspecified COPD type (HCC)    PLAN:    In order of problems listed above:  1. She has multiple mechanisms for shortness of breath including chronic atrial fibrillation LV dysfunction RV dysfunction and her underlying lung disease with a CT scan last fall that showed diffuse airway thickening atelectasis scarring and emphysema.  We will place her on Entresto and see if we can help her shortness of breath the cardiac component recheck a chest x-ray also check labs including a CBC and renal function.  Stop nitrates when she starts Entresto 2. Stable rate is controlled continue to monitor pacemaker in our clinic she has had AV nodal ablation and is not anticoagulated her decision.   Next appointment: 4 weeks   Medication Adjustments/Labs and Tests Ordered: Current medicines are reviewed at length with the patient today.  Concerns regarding medicines are outlined above.  Orders Placed This Encounter  Procedures  . CBC  . Basic metabolic panel  . Pro b natriuretic peptide (BNP)   Meds ordered this encounter  Medications  . sacubitril-valsartan (ENTRESTO) 24-26 MG    Sig: Take 1 tablet by mouth 2 (two) times daily.    Dispense:  60 tablet    Refill:  3    Chief Complaint  Patient presents with  . Follow-up  . Shortness of Breath  . Congestive Heart Failure    History of Present Illness:    Kimberly Raymond is a 86 y.o. female with a hx of musculoskeletal chest wall pain chronic atrial fibrillation AV nodal ablation with permanent pacemaker COPD sleep apnea and hypertension with diastolic heart  failure last seen 02/25/2020 with decompensated heart failure. Compliance with diet, lifestyle and medications: Yes  She was seen by EP they increased her diuretic but did not help her.  She misses it frequently on average is taking it 4 to 5 days a week in such a cough with plan to take her diuretic every day she wears oxygen at nighttime she is short of breath ambulating in the home but has no edema.  No cough or bronchospasm.  Physical examination shows no volume overload.  She has both left and right ventricular dysfunction.  Oxygen sats are running 92% at home off oxygen during the day.  I spoke with her and her daughter her EF is mildly reduced I think that we need to try to improve the quality of her life she is not fluid overloaded continue her current diuretic emigrant use a trial of Entresto to see if we can help her she will check blood pressures at home and tell us if she gets an excessive response recheck full labs today including CBC renal function and recheck her proBNP level.  I will see her back in a month to assess her response.  She also have a chest x-ray performed she has diffuse fibrotic rales on physical exam   Ref Range & Units 10 d ago 1 yr ago  NT-Pro BNP 0 - 738 pg/mL 799High  621    Other studies Reviewed:  Additional studies/ records that were reviewed today include: TTE 02/04/20  Review of the above records today demonstrates:   1. Left ventricular ejection fraction, by estimation, is 45 to 50%. The  left ventricle has mildly decreased function. The left ventricle has no  regional wall motion abnormalities. There is mild concentric left  ventricular hypertrophy. Left ventricular  diastolic parameters are consistent with Grade I diastolic dysfunction  (impaired relaxation).   2. Right ventricular systolic function is mildly reduced. The right  ventricular size is moderately enlarged. Device lead seen in the right  ventricle.   3. Left atrial size was severely  dilated.   4. Right atrial size was severely dilated. Device lead seen in the right  atrium.   5. The mitral valve is normal in structure. Mild to moderate mitral valve  regurgitation. No evidence of mitral stenosis.   6. Tricuspid valve regurgitation is moderate.   7. The aortic valve is tricuspid. Aortic valve regurgitation is not  visualized. No aortic stenosis is present.   8. Moderate Pulmonary Hypertension. Pulmonary artery systolic pressure  54.9 mmHG.   9. Right to left shunt noted by color dopper (clip 60 & 61).  10. The inferior vena cava is dilated in size with <50% respiratory  variability, suggesting right atrial pressure of 15 mmHg.   Past Medical History:  Diagnosis Date  . Acute bronchitis, complicated 09/11/2018  . Age-related osteoporosis without current pathological fracture 05/18/2011   Last Assessment & Plan:  Relevant Hx: Course: Daily Update: Today's Plan:she is following with Dr. Albertha Ghee for this and recieves prolia twice a year and is due for this in May  Electronically signed by: [name], NP 02/16/16 2101  . Allergic rhinitis   . Amblyopia, right eye 11/08/2015  . Aortic atherosclerosis (HCC) 02/17/2018  . Arthritis 05/18/2011  . Branch retinal vein occlusion of left eye 11/08/2015  . Cardiac pacemaker in situ 05/18/2011  . Cerebral thrombosis with cerebral infarction (HCC) 05/18/2011  . Chronic atrial fibrillation (HCC) 11/22/2015   Overview:  With His ablation and permanent pacemaker for rate control, CHADS2 vasc score= 7  Overview:  Overview:  With His ablation and permanent pacemaker for rate control, CHADS2 vasc score= 7  Last Assessment & Plan:  Relevant Hx: Course: Daily Update: Today's Plan:she has pacemaker and is no longer taking eliquis   Electronically signed by: [name], NP 02/16/16 2059  . Chronic bilateral back pain 08/14/2017  . Chronic diastolic heart failure (HCC) 11/22/2015  . Chronic edema 04/01/2017  .  Chronic obstructive pulmonary disease (HCC) 10/18/2016  . Chronic pansinusitis 05/22/2018  . Chronic respiratory failure with hypoxia (HCC) 03/06/2018  . Chronic rhinitis 05/22/2018  . Contracture of finger joint, left 10/30/2016  . COPD (chronic obstructive pulmonary disease) (HCC)   . Coronary artery calcification seen on CAT scan 04/01/2018   Seen on CT scan March 2019 with three-vessel calcification  . Coronary artery disease involving native coronary artery of native heart with angina pectoris (HCC) 04/01/2018   Formatting of this note might be different from the original. Seen on CT scan March 2019 with three-vessel calcification  . Costochondral chest pain 11/22/2015  . Dizziness 05/22/2016   Last Assessment & Plan:  She is going to have meclizine sent in for her and she is aware of the need to have safety due to sleepy effect  . Dupuytren's contracture of left hand 10/30/2016  . Epiretinal membrane (ERM) of left eye 11/08/2015  . Essential hypertension  02/01/2016   Last Assessment & Plan:  Relevant Hx: Course: Daily Update: Today's Plan:this is fair for her right now and with her being sick it is not going to be ideal right now will follow  Electronically signed by: [name], NP 02/16/16 2057  . Essential tremor 05/18/2011  . Extensor tendonitis of foot 12/25/2018  . Gait abnormality 08/15/2017  . High risk medication use 05/30/2017  . Hip joint pain 12/29/2013  . History of dizziness 05/22/2016   Last Assessment & Plan:  She is going to have meclizine sent in for her and she is aware of the need to have safety due to sleepy effect  . Hypertensive heart disease with heart failure (HCC) 11/22/2015  . Hypertensive retinopathy of both eyes 11/08/2015   Last Assessment & Plan:  Relevant Hx: Course: Daily Update: Today's Plan:she is following with specialist for this currently  Electronically signed by: [name], NP 02/16/16 2059  . Iron deficiency 05/22/2018  .  Laryngopharyngeal reflux   . Left epiretinal membrane 06/26/2016  . Left foot pain 04/08/2014  . Lumbar radiculopathy, right 06/01/2020  . Mixed hyperlipidemia 02/01/2016   Last Assessment & Plan:  Relevant Hx: Course: Daily Update: Today's Plan:there is going to be fasting lipids done   Electronically signed by: [name], NP 02/16/16 2058  . Multiple falls 08/15/2017  . Nocturnal oxygen desaturation   . Non-rheumatic mitral regurgitation 01/08/2016   Overview:  Mild  . Osteoporosis 05/18/2011  . Other dietary vitamin B12 deficiency anemia 04/15/2018  . Other spondylosis with radiculopathy, lumbar region 06/01/2020  . Pacemaker 05/18/2011   Manufacturer of Device: [employer]  Type of DeviceInsurance risk surveyor      . Posterior vitreous detachment of both eyes 06/26/2016  . Presence of cardiac pacemaker 05/18/2011  . Pressure injury of skin of dorsum of right foot 04/10/2018  . Pseudophakia of both eyes 11/08/2015  . PVD (posterior vitreous detachment), both eyes 11/08/2015  . Retinal hemorrhage of both eyes 06/26/2016  . Rib pain on right side 12/27/2017  . S/P revision of total knee 08/10/2013  . S/P total knee arthroplasty 12/29/2013  . Shortness of breath 12/30/2017  . Supplemental oxygen dependent 03/21/2018    Past Surgical History:  Procedure Laterality Date  . ABDOMINAL HYSTERECTOMY    . BACK SURGERY    . FEMUR FRACTURE SURGERY Right   . FINGER SURGERY Right    5 finger  . PACEMAKER INSERTION    . PR PART PALMAR FASCIEC,OPEN 1 DIGIT Left 10/30/2016     . REPLACEMENT TOTAL KNEE Right   . REVISION TOTAL KNEE ARTHROPLASTY Right   . SKIN GRAFT    . TONSILLECTOMY    . TUBAL LIGATION      Current Medications: Current Meds  Medication Sig  . albuterol (PROVENTIL HFA;VENTOLIN HFA) 108 (90 BASE) MCG/ACT inhaler Inhale 2 puffs into the lungs every 4 (four) hours as needed for wheezing or shortness of breath.  . Ascorbic Acid (VITAMIN C) 1000 MG tablet Take 1 tablet by mouth  daily.  . Biotin 1 MG CAPS Take 1 capsule by mouth daily.  . Cholecalciferol (VITAMIN D3) 2000 units capsule Take 2 capsules by mouth daily.   Marland Kitchen CRANBERRY EXTRACT PO Take 1 tablet by mouth daily.  . cyanocobalamin (,VITAMIN B-12,) 1000 MCG/ML injection Inject 1,000 mcg into the muscle every 14 (fourteen) days.   Marland Kitchen denosumab (PROLIA) 60 MG/ML SOLN injection Inject 60 mg into the skin every 6 (six)  months.  . Dextran 70-Hypromellose (ARTIFICIAL TEARS PF OP) Place 1 drop into both eyes 3 (three) times daily.  . famotidine (PEPCID) 20 MG tablet Take 20 mg by mouth 2 (two) times daily.  . fexofenadine (ALLEGRA) 180 MG tablet Take 180 mg by mouth daily.  . furosemide (LASIX) 20 MG tablet Take 1 tablet (20 mg total) by mouth daily.  . Garlic 1500 MG CAPS Take 1,500 mg by mouth daily.  . isosorbide mononitrate (IMDUR) 30 MG 24 hr tablet Take 30 mg by mouth daily.  . primidone (MYSOLINE) 250 MG tablet Take 250 mg by mouth in the morning and at bedtime.  . Probiotic Product (PROBIOTIC DAILY PO) Take 1 tablet by mouth daily.  . Red Yeast Rice Extract 600 MG CAPS Take 1 capsule by mouth daily.  . traMADol (ULTRAM) 50 MG tablet Take 50 mg by mouth every 6 (six) hours as needed for moderate pain.   . TURMERIC PO Take 1 tablet by mouth 2 (two) times daily.  Marland Kitchen umeclidinium-vilanterol (ANORO ELLIPTA) 62.5-25 MCG/INH AEPB Inhale 1 puff into the lungs daily.  . vitamin E 1000 UNIT capsule Take 1 capsule by mouth daily.     Allergies:   Lidocaine hcl, Procaine, Propranolol, Codeine, Epinephrine, Gabapentin, Levofloxacin, Other, Latex, and Sulfamethoxazole   Social History   Socioeconomic History  . Marital status: Divorced    Spouse name: Not on file  . Number of children: Not on file  . Years of education: Not on file  . Highest education level: Not on file  Occupational History  . Not on file  Tobacco Use  . Smoking status: Former Smoker    Packs/day: 1.00    Years: 35.00    Pack years: 35.00     Types: Cigarettes    Quit date: 10/22/1985    Years since quitting: 34.6  . Smokeless tobacco: Never Used  Vaping Use  . Vaping Use: Never used  Substance and Sexual Activity  . Alcohol use: No  . Drug use: No  . Sexual activity: Not on file  Other Topics Concern  . Not on file  Social History Narrative  . Not on file   Social Determinants of Health   Financial Resource Strain:   . Difficulty of Paying Living Expenses: Not on file  Food Insecurity:   . Worried About Programme researcher, broadcasting/film/video in the Last Year: Not on file  . Ran Out of Food in the Last Year: Not on file  Transportation Needs:   . Lack of Transportation (Medical): Not on file  . Lack of Transportation (Non-Medical): Not on file  Physical Activity:   . Days of Exercise per Week: Not on file  . Minutes of Exercise per Session: Not on file  Stress:   . Feeling of Stress : Not on file  Social Connections:   . Frequency of Communication with Friends and Family: Not on file  . Frequency of Social Gatherings with Friends and Family: Not on file  . Attends Religious Services: Not on file  . Active Member of Clubs or Organizations: Not on file  . Attends Banker Meetings: Not on file  . Marital Status: Not on file     Family History: The patient's family history includes ALS in her mother; Stroke in her sister; Throat cancer in her sister; Ulcers in her father. ROS:   Please see the history of present illness.    All other systems reviewed and are negative.  EKGs/Labs/Other Studies  Reviewed:    The following studies were reviewed today:    Recent Labs: Renal function 12/25/2019 potassium 4.1 creatinine 0.51 normal GFR 07/13/2019: ALT 20; B Natriuretic Peptide 148.8; BUN 8; Creatinine, Ser 0.58; Hemoglobin 12.3; Platelets 177; Potassium 4.1; Sodium 139 06/06/2020: NT-Pro BNP 799  Recent Lipid Panel No results found for: CHOL, TRIG, HDL, CHOLHDL, VLDL, LDLCALC, LDLDIRECT  Physical Exam:    VS:  BP  138/68   Pulse 78   Ht 5\' 6"  (1.676 m)   Wt 131 lb (59.4 kg)   SpO2 90%   BMI 21.14 kg/m     Wt Readings from Last 3 Encounters:  06/17/20 131 lb (59.4 kg)  06/06/20 131 lb (59.4 kg)  02/25/20 134 lb 6.4 oz (61 kg)     GEN: She looks frail well nourished, well developed in no acute distress HEENT: Normal NECK: No JVD; No carotid bruits LYMPHATICS: No lymphadenopathy CARDIAC: S1 variable no S3 RRR, no murmurs, rubs, gallops RESPIRATORY: Diminished breath sounds she has fibrotic rales diffusely ABDOMEN: Soft, non-tender, non-distended MUSCULOSKELETAL:  No edema; No deformity  SKIN: Warm and dry NEUROLOGIC:  Alert and oriented x 3 PSYCHIATRIC:  Normal affect    Signed, 04/26/20, MD  06/17/2020 10:15 AM    [facility]

## 2020-06-17 ENCOUNTER — Encounter: Payer: Self-pay | Admitting: Cardiology

## 2020-06-17 ENCOUNTER — Ambulatory Visit (INDEPENDENT_AMBULATORY_CARE_PROVIDER_SITE_OTHER): Payer: Medicare Other | Admitting: *Deleted

## 2020-06-17 ENCOUNTER — Ambulatory Visit (INDEPENDENT_AMBULATORY_CARE_PROVIDER_SITE_OTHER): Payer: Medicare Other | Admitting: Cardiology

## 2020-06-17 ENCOUNTER — Other Ambulatory Visit: Payer: Self-pay

## 2020-06-17 VITALS — BP 138/68 | HR 78 | Ht 66.0 in | Wt 131.0 lb

## 2020-06-17 DIAGNOSIS — Z950 Presence of cardiac pacemaker: Secondary | ICD-10-CM | POA: Diagnosis not present

## 2020-06-17 DIAGNOSIS — J449 Chronic obstructive pulmonary disease, unspecified: Secondary | ICD-10-CM | POA: Diagnosis not present

## 2020-06-17 DIAGNOSIS — I110 Hypertensive heart disease with heart failure: Secondary | ICD-10-CM

## 2020-06-17 DIAGNOSIS — I482 Chronic atrial fibrillation, unspecified: Secondary | ICD-10-CM

## 2020-06-17 MED ORDER — ENTRESTO 24-26 MG PO TABS: 1.0000 | ORAL_TABLET | Freq: Two times a day (BID) | ORAL | 3 refills | Status: DC

## 2020-06-17 NOTE — Addendum Note (Signed)
Addended by: Delorse Limber I on: 06/17/2020 10:17 AM   Modules accepted: Orders

## 2020-06-17 NOTE — Patient Instructions (Addendum)
Medication Instructions:  Your physician has recommended you make the following change in your medication:  START: Entresto 24/26 mg take one tablet by mouth twice daily.  STOP: Imdur *If you need a refill on your cardiac medications before your next appointment, please call your pharmacy*   Lab Work: Your physician recommends that you return for lab work in: TODAY CBC, BMP, ProBNP If you have labs (blood work) drawn today and your tests are completely normal, you will receive your results only by: Marland Kitchen MyChart Message (if you have MyChart) OR . A paper copy in the mail If you have any lab test that is abnormal or we need to change your treatment, we will call you to review the results.   Testing/Procedures: None   Follow-Up: At [facility], you and your health needs are our priority.  As part of our continuing mission to provide you with exceptional heart care, we have created designated Provider Care Teams.  These Care Teams include your primary Cardiologist (physician) and Advanced Practice Providers (APPs -  Physician Assistants and Nurse Practitioners) who all work together to provide you with the care you need, when you need it.  We recommend signing up for the patient portal called "MyChart".  Sign up information is provided on this After Visit Summary.  MyChart is used to connect with patients for Virtual Visits (Telemedicine).  Patients are able to view lab/test results, encounter notes, upcoming appointments, etc.  Non-urgent messages can be sent to your provider as well.   To learn more about what you can do with MyChart, go to [email or web address].    Your next appointment:   4 week(s)  The format for your next appointment:   In Person  Provider:   Norman Herrlich, MD   Other Instructions

## 2020-06-18 LAB — CBC
Hematocrit: 35.9 % (ref 34.0–46.6)
Hemoglobin: 12.4 g/dL (ref 11.1–15.9)
MCH: 30.2 pg (ref 26.6–33.0)
MCHC: 34.5 g/dL (ref 31.5–35.7)
MCV: 88 fL (ref 79–97)
Platelets: 186 10*3/uL (ref 150–450)
RBC: 4.1 x10E6/uL (ref 3.77–5.28)
RDW: 13.7 % (ref 11.7–15.4)
WBC: 5.6 10*3/uL (ref 3.4–10.8)

## 2020-06-18 LAB — BASIC METABOLIC PANEL
BUN/Creatinine Ratio: 23 (ref 12–28)
BUN: 12 mg/dL (ref 8–27)
CO2: 28 mmol/L (ref 20–29)
Calcium: 9.7 mg/dL (ref 8.7–10.3)
Chloride: 100 mmol/L (ref 96–106)
Creatinine, Ser: 0.52 mg/dL — ABNORMAL LOW (ref 0.57–1.00)
GFR calc Af Amer: 101 mL/min/{1.73_m2} (ref >59)
GFR calc non Af Amer: 88 mL/min/{1.73_m2} (ref >59)
Glucose: 106 mg/dL — ABNORMAL HIGH (ref 65–99)
Potassium: 4.4 mmol/L (ref 3.5–5.2)
Sodium: 141 mmol/L (ref 134–144)

## 2020-06-18 LAB — PRO B NATRIURETIC PEPTIDE: NT-Pro BNP: 647 pg/mL (ref 0–738)

## 2020-06-19 LAB — CUP PACEART REMOTE DEVICE CHECK
Battery Impedance: 3201 Ohm
Battery Remaining Longevity: 19 mo
Battery Voltage: 2.72 V
Brady Statistic RV Percent Paced: 100 %
Date Time Interrogation Session: 20210827130619
Implantable Lead Implant Date: 20050802
Implantable Lead Location: 753860
Implantable Lead Model: 4076
Implantable Pulse Generator Implant Date: 20140717
Lead Channel Impedance Value: 0 Ohm
Lead Channel Impedance Value: 571 Ohm
Lead Channel Pacing Threshold Amplitude: 0.625 V
Lead Channel Pacing Threshold Pulse Width: 0.4 ms
Lead Channel Setting Pacing Amplitude: 2.5 V
Lead Channel Setting Pacing Pulse Width: 0.4 ms
Lead Channel Setting Sensing Sensitivity: 4 mV

## 2020-06-20 ENCOUNTER — Telehealth: Payer: Self-pay

## 2020-06-20 NOTE — Telephone Encounter (Signed)
Spoke with patient regarding results and recommendation.  Patient verbalizes understanding and is agreeable to plan of care. Advised patient to call back with any issues or concerns.  

## 2020-06-20 NOTE — Telephone Encounter (Signed)
-----   Message from Baldo Daub, MD sent at 06/19/2020 11:41 AM EDT ----- Good result she is to initiate [facility]

## 2020-06-21 NOTE — Progress Notes (Signed)
Remote pacemaker transmission.   

## 2020-06-23 DIAGNOSIS — H02831 Dermatochalasis of right upper eyelid: Secondary | ICD-10-CM | POA: Insufficient documentation

## 2020-07-18 NOTE — Progress Notes (Signed)
Cardiology Office Note:    Date:  07/19/2020   ID:  Kimberly Raymond, DOB 06/18/1934, MRN [id number]  PCP:  Gordan PaymentGrisso, Greg A., MD  Cardiologist:  Norman HerrlichBrian Sigfredo Schreier, MD    Referring MD: Gordan PaymentGrisso, Greg A., MD    ASSESSMENT:    1. Chronic combined systolic and diastolic heart failure (HCC)   2. Hypertensive heart disease with heart failure (HCC)   3. Chronic obstructive pulmonary disease, unspecified COPD type (HCC)   4. Pacemaker    PLAN:    In order of problems listed above:  1. She is functionally improved with mid range reduced ejection fraction after initiating Entresto and taking her diuretic daily.  We will increase her dose continue her current loop diuretic check labs including BMP proBNP and if stable renal function will start an SGLT2 inhibitor. 2. Stable BP at target heart failure compensated 3. Stable managed with her PCP 4. Stable she has had AV nodal ablation for atrial fibrillation she is permanently paced needs a smart map in the operating room and she is declined anticoagulant therapy aware of her risk of stroke   Next appointment: 3 months   Medication Adjustments/Labs and Tests Ordered: Current medicines are reviewed at length with the patient today.  Concerns regarding medicines are outlined above.  Orders Placed This Encounter  Procedures  . Basic metabolic panel  . Pro b natriuretic peptide (BNP)   Meds ordered this encounter  Medications  . sacubitril-valsartan (ENTRESTO) 49-51 MG    Sig: Take 1 tablet by mouth 2 (two) times daily.    Dispense:  60 tablet    Refill:  3    Chief Complaint  Patient presents with  . Follow-up    We had initiated Entresto at her last office visit  . Congestive Heart Failure    History of Present Illness:    Kimberly JubaCarol B Zerbe is a 86 y.o. female with a hx of  musculoskeletal chest wall pain chronic atrial fibrillation AV nodal ablation with permanent pacemaker COPD sleep apnea and hypertension with diastolic heart failure  last  seen 06/17/2020.  Her heart failure is decompensated there is an element of noncompliance with diuretic and she is initiated on Entresto.  She has markedly improved quality of her life is better uses oxygen only at nighttime as no edema is no longer short of breath or weak no chest pain palpitation or syncope.  She informs me she is planning eye surgery and I told her there is no issues from my perspective except she is pacemaker dependent and requires a smart magnet in the operating room Compliance with diet, lifestyle and medications: Yes   1 mo ago  (06/17/20) 1 mo ago  (06/06/20) 1 yr ago  (01/02/19)   NT-Pro BNP 0 - 738 pg/mL 647  799High CM  621   Other studies Reviewed: Additional studies/ records that were reviewed today include: TTE 02/04/20  Review of the above records today demonstrates:   1. Left ventricular ejection fraction, by estimation, is 45 to 50%. The  left ventricle has mildly decreased function. The left ventricle has no  regional wall motion abnormalities. There is mild concentric left  ventricular hypertrophy. Left ventricular  diastolic parameters are consistent with Grade I diastolic dysfunction  (impaired relaxation).   2. Right ventricular systolic function is mildly reduced. The right  ventricular size is moderately enlarged. Device lead seen in the right  ventricle.   3. Left atrial size was severely dilated.   4. Right  atrial size was severely dilated. Device lead seen in the right  atrium.   5. The mitral valve is normal in structure. Mild to moderate mitral valve  regurgitation. No evidence of mitral stenosis.   6. Tricuspid valve regurgitation is moderate.   7. The aortic valve is tricuspid. Aortic valve regurgitation is not  visualized. No aortic stenosis is present.   8. Moderate Pulmonary Hypertension. Pulmonary artery systolic pressure  54.9 mmHG.   9. Right to left shunt noted by color dopper (clip 60 & 61).  10. The inferior vena cava is dilated  in size with <50% respiratory  variability, suggesting right atrial pressure of 15 mmHg.  Past Medical History:  Diagnosis Date  . Acute bronchitis, complicated 09/11/2018  . Age-related osteoporosis without current pathological fracture 05/18/2011   Last Assessment & Plan:  Relevant Hx: Course: Daily Update: Today's Plan:she is following with Dr. Albertha Ghee for this and recieves prolia twice a year and is due for this in May  Electronically signed by: [name], NP 02/16/16 2101  . Allergic rhinitis   . Amblyopia, right eye 11/08/2015  . Aortic atherosclerosis (HCC) 02/17/2018  . Arthritis 05/18/2011  . Branch retinal vein occlusion of left eye 11/08/2015  . Cardiac pacemaker in situ 05/18/2011  . Cerebral thrombosis with cerebral infarction (HCC) 05/18/2011  . Chronic atrial fibrillation (HCC) 11/22/2015   Overview:  With His ablation and permanent pacemaker for rate control, CHADS2 vasc score= 7  Overview:  Overview:  With His ablation and permanent pacemaker for rate control, CHADS2 vasc score= 7  Last Assessment & Plan:  Relevant Hx: Course: Daily Update: Today's Plan:she has pacemaker and is no longer taking eliquis   Electronically signed by: [name], NP 02/16/16 2059  . Chronic bilateral back pain 08/14/2017  . Chronic diastolic heart failure (HCC) 11/22/2015  . Chronic edema 04/01/2017  . Chronic obstructive pulmonary disease (HCC) 10/18/2016  . Chronic pansinusitis 05/22/2018  . Chronic respiratory failure with hypoxia (HCC) 03/06/2018  . Chronic rhinitis 05/22/2018  . Contracture of finger joint, left 10/30/2016  . COPD (chronic obstructive pulmonary disease) (HCC)   . Coronary artery calcification seen on CAT scan 04/01/2018   Seen on CT scan March 2019 with three-vessel calcification  . Coronary artery disease involving native coronary artery of native heart with angina pectoris (HCC) 04/01/2018   Formatting of this note might be different from the  original. Seen on CT scan March 2019 with three-vessel calcification  . Costochondral chest pain 11/22/2015  . Dizziness 05/22/2016   Last Assessment & Plan:  She is going to have meclizine sent in for her and she is aware of the need to have safety due to sleepy effect  . Dupuytren's contracture of left hand 10/30/2016  . Epiretinal membrane (ERM) of left eye 11/08/2015  . Essential hypertension 02/01/2016   Last Assessment & Plan:  Relevant Hx: Course: Daily Update: Today's Plan:this is fair for her right now and with her being sick it is not going to be ideal right now will follow  Electronically signed by: [name], NP 02/16/16 2057  . Essential tremor 05/18/2011  . Extensor tendonitis of foot 12/25/2018  . Gait abnormality 08/15/2017  . High risk medication use 05/30/2017  . Hip joint pain 12/29/2013  . History of dizziness 05/22/2016   Last Assessment & Plan:  She is going to have meclizine sent in for her and she is aware of the need to have safety due to  sleepy effect  . Hypertensive heart disease with heart failure (HCC) 11/22/2015  . Hypertensive retinopathy of both eyes 11/08/2015   Last Assessment & Plan:  Relevant Hx: Course: Daily Update: Today's Plan:she is following with specialist for this currently  Electronically signed by: [name], NP 02/16/16 2059  . Iron deficiency 05/22/2018  . Laryngopharyngeal reflux   . Left epiretinal membrane 06/26/2016  . Left foot pain 04/08/2014  . Lumbar radiculopathy, right 06/01/2020  . Mixed hyperlipidemia 02/01/2016   Last Assessment & Plan:  Relevant Hx: Course: Daily Update: Today's Plan:there is going to be fasting lipids done   Electronically signed by: [name], NP 02/16/16 2058  . Multiple falls 08/15/2017  . Nocturnal oxygen desaturation   . Non-rheumatic mitral regurgitation 01/08/2016   Overview:  Mild  . Osteoporosis 05/18/2011  . Other dietary vitamin B12 deficiency anemia 04/15/2018  . Other  spondylosis with radiculopathy, lumbar region 06/01/2020  . Pacemaker 05/18/2011   Manufacturer of Device: [employer]onic  Type of DeviceInsurance risk surveyor      . Posterior vitreous detachment of both eyes 06/26/2016  . Presence of cardiac pacemaker 05/18/2011  . Pressure injury of skin of dorsum of right foot 04/10/2018  . Pseudophakia of both eyes 11/08/2015  . PVD (posterior vitreous detachment), both eyes 11/08/2015  . Retinal hemorrhage of both eyes 06/26/2016  . Rib pain on right side 12/27/2017  . S/P revision of total knee 08/10/2013  . S/P total knee arthroplasty 12/29/2013  . Shortness of breath 12/30/2017  . Supplemental oxygen dependent 03/21/2018    Past Surgical History:  Procedure Laterality Date  . ABDOMINAL HYSTERECTOMY    . BACK SURGERY    . FEMUR FRACTURE SURGERY Right   . FINGER SURGERY Right    5 finger  . PACEMAKER INSERTION    . PR PART PALMAR FASCIEC,OPEN 1 DIGIT Left 10/30/2016     . REPLACEMENT TOTAL KNEE Right   . REVISION TOTAL KNEE ARTHROPLASTY Right   . SKIN GRAFT    . TONSILLECTOMY    . TUBAL LIGATION      Current Medications: Current Meds  Medication Sig  . albuterol (PROVENTIL HFA;VENTOLIN HFA) 108 (90 BASE) MCG/ACT inhaler Inhale 2 puffs into the lungs every 4 (four) hours as needed for wheezing or shortness of breath.  . Ascorbic Acid (VITAMIN C) 1000 MG tablet Take 1 tablet by mouth daily.  . Biotin 1 MG CAPS Take 1 capsule by mouth daily.  . cefdinir (OMNICEF) 300 MG capsule Take 300 mg by mouth at bedtime. X 7 DAYS  . Cholecalciferol (VITAMIN D3) 2000 units capsule Take 2 capsules by mouth daily.   Marland Kitchen CRANBERRY EXTRACT PO Take 1 tablet by mouth daily.  . cyanocobalamin (,VITAMIN B-12,) 1000 MCG/ML injection Inject 1,000 mcg into the muscle every 14 (fourteen) days.   Marland Kitchen denosumab (PROLIA) 60 MG/ML SOLN injection Inject 60 mg into the skin every 6 (six) months.  . Dextran 70-Hypromellose (ARTIFICIAL TEARS PF OP) Place 1 drop into both eyes 3 (three)  times daily.  . famotidine (PEPCID) 20 MG tablet Take 20 mg by mouth 2 (two) times daily.  . fexofenadine (ALLEGRA) 180 MG tablet Take 180 mg by mouth daily.  . furosemide (LASIX) 20 MG tablet Take 1 tablet (20 mg total) by mouth daily.  . Garlic 1500 MG CAPS Take 1,500 mg by mouth daily.  . Multiple Vitamin (MULTIVITAMIN) tablet Take 1 tablet by mouth daily.  . primidone (MYSOLINE) 250 MG tablet  Take 250 mg by mouth in the morning and at bedtime.  . Probiotic Product (PROBIOTIC DAILY PO) Take 1 tablet by mouth daily.  . Red Yeast Rice Extract 600 MG CAPS Take 1 capsule by mouth daily.  . traMADol (ULTRAM) 50 MG tablet Take 50 mg by mouth every 6 (six) hours as needed for moderate pain.   . TURMERIC PO Take 1 tablet by mouth 2 (two) times daily.  Marland Kitchen umeclidinium-vilanterol (ANORO ELLIPTA) 62.5-25 MCG/INH AEPB Inhale 1 puff into the lungs daily.  . vitamin E 1000 UNIT capsule Take 1 capsule by mouth daily.  . [DISCONTINUED] Multiple Vitamin (MULTIVITAMIN) capsule Take 1 capsule by mouth daily.  . [DISCONTINUED] sacubitril-valsartan (ENTRESTO) 24-26 MG Take 1 tablet by mouth 2 (two) times daily.     Allergies:   Lidocaine hcl, Procaine, Propranolol, Codeine, Epinephrine, Gabapentin, Levofloxacin, Other, Latex, and Sulfamethoxazole   Social History   Socioeconomic History  . Marital status: Divorced    Spouse name: Not on file  . Number of children: Not on file  . Years of education: Not on file  . Highest education level: Not on file  Occupational History  . Not on file  Tobacco Use  . Smoking status: Former Smoker    Packs/day: 1.00    Years: 35.00    Pack years: 35.00    Types: Cigarettes    Quit date: 10/22/1985    Years since quitting: 34.7  . Smokeless tobacco: Never Used  Vaping Use  . Vaping Use: Never used  Substance and Sexual Activity  . Alcohol use: No  . Drug use: No  . Sexual activity: Not on file  Other Topics Concern  . Not on file  Social History Narrative  .  Not on file   Social Determinants of Health   Financial Resource Strain:   . Difficulty of Paying Living Expenses: Not on file  Food Insecurity:   . Worried About Programme researcher, broadcasting/film/video in the Last Year: Not on file  . Ran Out of Food in the Last Year: Not on file  Transportation Needs:   . Lack of Transportation (Medical): Not on file  . Lack of Transportation (Non-Medical): Not on file  Physical Activity:   . Days of Exercise per Week: Not on file  . Minutes of Exercise per Session: Not on file  Stress:   . Feeling of Stress : Not on file  Social Connections:   . Frequency of Communication with Friends and Family: Not on file  . Frequency of Social Gatherings with Friends and Family: Not on file  . Attends Religious Services: Not on file  . Active Member of Clubs or Organizations: Not on file  . Attends Banker Meetings: Not on file  . Marital Status: Not on file     Family History: The patient's family history includes ALS in her mother; Stroke in her sister; Throat cancer in her sister; Ulcers in her father. ROS:   Please see the history of present illness.    All other systems reviewed and are negative.  EKGs/Labs/Other Studies Reviewed:    The following studies were reviewed today:  I personally reviewed her last pacemaker download 06/17/2020 normal battery lead parameters and function.  She is 100% RV paced.  Recent Labs: 06/17/2020: BUN 12; Creatinine, Ser 0.52; Hemoglobin 12.4; NT-Pro BNP 647; Platelets 186; Potassium 4.4; Sodium 141  Recent Lipid Panel No results found for: CHOL, TRIG, HDL, CHOLHDL, VLDL, LDLCALC, LDLDIRECT  Physical Exam:  VS:  BP 136/77   Pulse 80   Ht 5\' 6"  (1.676 m)   Wt 131 lb (59.4 kg)   SpO2 94%   BMI 21.14 kg/m     Wt Readings from Last 3 Encounters:  07/19/20 131 lb (59.4 kg)  06/17/20 131 lb (59.4 kg)  06/06/20 131 lb (59.4 kg)     GEN: She looks less frail well nourished, well developed in no acute  distress HEENT: Normal NECK: No JVD; No carotid bruits LYMPHATICS: No lymphadenopathy CARDIAC: Variable S1 RRR, no murmurs, rubs, gallops RESPIRATORY:  Clear to auscultation without rales, wheezing or rhonchi  ABDOMEN: Soft, non-tender, non-distended MUSCULOSKELETAL:  No edema; No deformity  SKIN: Warm and dry NEUROLOGIC:  Alert and oriented x 3 PSYCHIATRIC:  Normal affect    Signed, 06/08/20, MD  07/19/2020 11:40 AM    [facility]

## 2020-07-19 ENCOUNTER — Ambulatory Visit (INDEPENDENT_AMBULATORY_CARE_PROVIDER_SITE_OTHER): Payer: Medicare Other | Admitting: Cardiology

## 2020-07-19 ENCOUNTER — Other Ambulatory Visit: Payer: Self-pay

## 2020-07-19 ENCOUNTER — Encounter: Payer: Self-pay | Admitting: Cardiology

## 2020-07-19 VITALS — BP 136/77 | HR 80 | Ht 66.0 in | Wt 131.0 lb

## 2020-07-19 DIAGNOSIS — I5042 Chronic combined systolic (congestive) and diastolic (congestive) heart failure: Secondary | ICD-10-CM | POA: Diagnosis not present

## 2020-07-19 DIAGNOSIS — Z950 Presence of cardiac pacemaker: Secondary | ICD-10-CM

## 2020-07-19 DIAGNOSIS — I110 Hypertensive heart disease with heart failure: Secondary | ICD-10-CM | POA: Diagnosis not present

## 2020-07-19 DIAGNOSIS — J449 Chronic obstructive pulmonary disease, unspecified: Secondary | ICD-10-CM

## 2020-07-19 MED ORDER — ENTRESTO 49-51 MG PO TABS: 1.0000 | ORAL_TABLET | Freq: Two times a day (BID) | ORAL | 3 refills | Status: DC

## 2020-07-19 NOTE — Patient Instructions (Signed)
Medication Instructions:  Your physician has recommended you make the following change in your medication:  STOP: Entresto 24/26 START: Entresto 49/51 mg take one tablet by mouth twice daily *If you need a refill on your cardiac medications before your next appointment, please call your pharmacy*   Lab Work: Your physician recommends that you return for lab work in: TODAY BMP, ProBNP If you have labs (blood work) drawn today and your tests are completely normal, you will receive your results only by: Marland Kitchen MyChart Message (if you have MyChart) OR . A paper copy in the mail If you have any lab test that is abnormal or we need to change your treatment, we will call you to review the results.   Testing/Procedures: None   Follow-Up: At [facility], you and your health needs are our priority.  As part of our continuing mission to provide you with exceptional heart care, we have created designated Provider Care Teams.  These Care Teams include your primary Cardiologist (physician) and Advanced Practice Providers (APPs -  Physician Assistants and Nurse Practitioners) who all work together to provide you with the care you need, when you need it.  We recommend signing up for the patient portal called "MyChart".  Sign up information is provided on this After Visit Summary.  MyChart is used to connect with patients for Virtual Visits (Telemedicine).  Patients are able to view lab/test results, encounter notes, upcoming appointments, etc.  Non-urgent messages can be sent to your provider as well.   To learn more about what you can do with MyChart, go to [email or web address].    Your next appointment:   6 week(s)  The format for your next appointment:   In Person  Provider:   Norman Herrlich, MD   Other Instructions

## 2020-07-20 LAB — BASIC METABOLIC PANEL
BUN/Creatinine Ratio: 18 (ref 12–28)
BUN: 11 mg/dL (ref 8–27)
CO2: 25 mmol/L (ref 20–29)
Calcium: 9 mg/dL (ref 8.7–10.3)
Chloride: 102 mmol/L (ref 96–106)
Creatinine, Ser: 0.61 mg/dL (ref 0.57–1.00)
GFR calc Af Amer: 96 mL/min/{1.73_m2} (ref >59)
GFR calc non Af Amer: 84 mL/min/{1.73_m2} (ref >59)
Glucose: 100 mg/dL — ABNORMAL HIGH (ref 65–99)
Potassium: 4.4 mmol/L (ref 3.5–5.2)
Sodium: 141 mmol/L (ref 134–144)

## 2020-07-20 LAB — PRO B NATRIURETIC PEPTIDE: NT-Pro BNP: 921 pg/mL — ABNORMAL HIGH (ref 0–738)

## 2020-07-28 NOTE — Telephone Encounter (Signed)
Appointment has been rescheduled to November 11.  I replied to patient and let her know I would forward message to Dr Dulce Sellar regarding patient's fall.

## 2020-08-01 ENCOUNTER — Ambulatory Visit: Payer: Medicare Other | Admitting: Cardiology

## 2020-08-23 ENCOUNTER — Telehealth: Payer: Self-pay | Admitting: Cardiology

## 2020-08-23 NOTE — Telephone Encounter (Signed)
Kelli from [facility] called to confirm the patient's medication sacubitril-valsartan (ENTRESTO) 49-51 MG. Want to make sure patient is taking it right.. Please call

## 2020-08-23 NOTE — Telephone Encounter (Signed)
Tried calling Kelli back just now but she did not answer. I left her a detailed voicemail letting her know how the patient should be taking this medication and also gave our call back number for her to call back with any other questions.

## 2020-08-31 DIAGNOSIS — S72102D Unspecified trochanteric fracture of left femur, subsequent encounter for closed fracture with routine healing: Secondary | ICD-10-CM | POA: Insufficient documentation

## 2020-09-01 ENCOUNTER — Encounter: Payer: Self-pay | Admitting: Cardiology

## 2020-09-01 ENCOUNTER — Other Ambulatory Visit: Payer: Self-pay

## 2020-09-01 ENCOUNTER — Ambulatory Visit (INDEPENDENT_AMBULATORY_CARE_PROVIDER_SITE_OTHER): Payer: Medicare Other | Admitting: Cardiology

## 2020-09-01 VITALS — BP 147/78 | HR 80 | Ht 66.0 in | Wt 134.0 lb

## 2020-09-01 DIAGNOSIS — I482 Chronic atrial fibrillation, unspecified: Secondary | ICD-10-CM | POA: Diagnosis not present

## 2020-09-01 DIAGNOSIS — I110 Hypertensive heart disease with heart failure: Secondary | ICD-10-CM | POA: Diagnosis not present

## 2020-09-01 DIAGNOSIS — J449 Chronic obstructive pulmonary disease, unspecified: Secondary | ICD-10-CM | POA: Diagnosis not present

## 2020-09-01 DIAGNOSIS — Z950 Presence of cardiac pacemaker: Secondary | ICD-10-CM | POA: Diagnosis not present

## 2020-09-01 NOTE — Progress Notes (Signed)
Cardiology Office Note:    Date:  09/01/2020   ID:  Kimberly Raymond, DOB 07/21/1934, MRN [id number]  PCP:  Gordan PaymentGrisso, Greg A., MD  Cardiologist:  Norman HerrlichBrian Javelle Donigan, MD    Referring MD: Gordan PaymentGrisso, Greg A., MD    ASSESSMENT:    1. Hypertensive heart disease with heart failure (HCC)   2. Chronic atrial fibrillation (HCC)   3. Pacemaker   4. Chronic obstructive pulmonary disease, unspecified COPD type (HCC)    PLAN:    In order of problems listed above:  1. She is functionally improved with mildly reduced ejection fraction heart failure no fluid overload on her current loop diuretic and on intermediate range dose Entresto.  Continue the same consider up titration next visit not on a beta-blocker because of severe COPD 2. Rate controlled AV node ablation pacemaker with normal function.  We will continue to follow in device clinic.  Not anticoagulated her decision. 3. Stable continue current bronchodilators   Next appointment: 3 months   Medication Adjustments/Labs and Tests Ordered: Current medicines are reviewed at length with the patient today.  Concerns regarding medicines are outlined above.  No orders of the defined types were placed in this encounter.  No orders of the defined types were placed in this encounter.   Chief Complaint  Patient presents with  . Atrial Fibrillation    She has had AV nodal ablation and permanent pacemaker for rate control  . Hypertension  . Congestive Heart Failure    History of Present Illness:    Kimberly Raymond is a 86 y.o. female with a hx of chronic atrial fibrillation with AV node ablation and permanent pacemaker for rhythm control, COPD sleep apnea and hypertensive heart disease with mildly reduced ejection fraction 45 to 50%.  She recently had worsened heart failure and was initiated on Entresto.  She was last seen 07/19/2020.  Compliance with diet, lifestyle and medications: Yes  Her falls were not related to blood pressure.  When she is in  rehab they reduce the dose of her Sherryll Burgerntresto and she is back on full dose tolerates without lightheadedness and she is functionally improved and has no edema shortness of breath chest pain palpitation or syncope.  She is now using a walker for safety the edema she had in rehab with an unrestricted diet has resolved  [facility]: Hemoglobin 13.8 potassium 4.5 creatinine 0.40 GFR greater than 60 cc  [facility]/2020 discharge 10 6 with fracture of her femur left after a fall.  Her chest x-ray showed findings of chronic lung disease.  Her last device check 06/17/2020 showed her to be 100% RV paced normal device parameters function and leads estimated battery life 19 months. Past Medical History:  Diagnosis Date  . Acute bronchitis, complicated 09/11/2018  . Age-related osteoporosis without current pathological fracture 05/18/2011   Last Assessment & Plan:  Relevant Hx: Course: Daily Update: Today's Plan:she is following with Dr. Albertha Gheeebecca Bassett for this and recieves prolia twice a year and is due for this in May  Electronically signed by: Krystal ClarkMelissa Joyce Brown-Patram, NP 02/16/16 2101  . Allergic rhinitis   . Amblyopia, right eye 11/08/2015  . Aortic atherosclerosis (HCC) 02/17/2018  . Arthritis 05/18/2011  . Branch retinal vein occlusion of left eye 11/08/2015  . Cardiac pacemaker in situ 05/18/2011  . Cerebral thrombosis with cerebral infarction (HCC) 05/18/2011  . Chronic atrial fibrillation (HCC) 11/22/2015   Overview:  With His ablation and  permanent pacemaker for rate control, CHADS2 vasc score= 7  Overview:  Overview:  With His ablation and permanent pacemaker for rate control, CHADS2 vasc score= 7  Last Assessment & Plan:  Relevant Hx: Course: Daily Update: Today's Plan:she has pacemaker and is no longer taking eliquis   Electronically signed by: [name], NP 02/16/16 2059  . Chronic bilateral back pain 08/14/2017  . Chronic  diastolic heart failure (HCC) 11/22/2015  . Chronic edema 04/01/2017  . Chronic obstructive pulmonary disease (HCC) 10/18/2016  . Chronic pansinusitis 05/22/2018  . Chronic respiratory failure with hypoxia (HCC) 03/06/2018  . Chronic rhinitis 05/22/2018  . Contracture of finger joint, left 10/30/2016  . COPD (chronic obstructive pulmonary disease) (HCC)   . Coronary artery calcification seen on CAT scan 04/01/2018   Seen on CT scan March 2019 with three-vessel calcification  . Coronary artery disease involving native coronary artery of native heart with angina pectoris (HCC) 04/01/2018   Formatting of this note might be different from the original. Seen on CT scan March 2019 with three-vessel calcification  . Costochondral chest pain 11/22/2015  . Dizziness 05/22/2016   Last Assessment & Plan:  She is going to have meclizine sent in for her and she is aware of the need to have safety due to sleepy effect  . Dupuytren's contracture of left hand 10/30/2016  . Epiretinal membrane (ERM) of left eye 11/08/2015  . Essential hypertension 02/01/2016   Last Assessment & Plan:  Relevant Hx: Course: Daily Update: Today's Plan:this is fair for her right now and with her being sick it is not going to be ideal right now will follow  Electronically signed by: [name], NP 02/16/16 2057  . Essential tremor 05/18/2011  . Extensor tendonitis of foot 12/25/2018  . Gait abnormality 08/15/2017  . High risk medication use 05/30/2017  . Hip joint pain 12/29/2013  . History of dizziness 05/22/2016   Last Assessment & Plan:  She is going to have meclizine sent in for her and she is aware of the need to have safety due to sleepy effect  . Hypertensive heart disease with heart failure (HCC) 11/22/2015  . Hypertensive retinopathy of both eyes 11/08/2015   Last Assessment & Plan:  Relevant Hx: Course: Daily Update: Today's Plan:she is following with specialist for this currently  Electronically signed by: [name], NP 02/16/16 2059  . Iron deficiency 05/22/2018  . Laryngopharyngeal reflux   . Left epiretinal membrane 06/26/2016  . Left foot pain 04/08/2014  . Lumbar radiculopathy, right 06/01/2020  . Mixed hyperlipidemia 02/01/2016   Last Assessment & Plan:  Relevant Hx: Course: Daily Update: Today's Plan:there is going to be fasting lipids done   Electronically signed by: [name], NP 02/16/16 2058  . Multiple falls 08/15/2017  . Nocturnal oxygen desaturation   . Non-rheumatic mitral regurgitation 01/08/2016   Overview:  Mild  . Osteoporosis 05/18/2011  . Other dietary vitamin B12 deficiency anemia 04/15/2018  . Other spondylosis with radiculopathy, lumbar region 06/01/2020  . Pacemaker 05/18/2011   Manufacturer of Device: [employer]onic  Type of DeviceInsurance risk surveyor      . Posterior vitreous detachment of both eyes 06/26/2016  . Presence of cardiac pacemaker 05/18/2011  . Pressure injury of skin of dorsum of right foot 04/10/2018  . Pseudophakia of both eyes 11/08/2015  . PVD (posterior vitreous detachment), both eyes 11/08/2015  . Retinal hemorrhage of both eyes 06/26/2016  . Rib pain on right side 12/27/2017  .  S/P revision of total knee 08/10/2013  . S/P total knee arthroplasty 12/29/2013  . Shortness of breath 12/30/2017  . Supplemental oxygen dependent 03/21/2018    Past Surgical History:  Procedure Laterality Date  . ABDOMINAL HYSTERECTOMY    . BACK SURGERY    . FEMUR FRACTURE SURGERY Right   . FINGER SURGERY Right    5 finger  . PACEMAKER INSERTION    . PR PART PALMAR FASCIEC,OPEN 1 DIGIT Left 10/30/2016     . REPLACEMENT TOTAL KNEE Right   . REVISION TOTAL KNEE ARTHROPLASTY Right   . SKIN GRAFT    . TONSILLECTOMY    . TUBAL LIGATION      Current Medications: Current Meds  Medication Sig  . acetaminophen (TYLENOL) 325 MG tablet Take 650 mg by mouth every 6 (six) hours as needed.  Marland Kitchen albuterol (PROVENTIL HFA;VENTOLIN HFA) 108 (90 BASE) MCG/ACT inhaler Inhale  2 puffs into the lungs every 4 (four) hours as needed for wheezing or shortness of breath.  . Ascorbic Acid (VITAMIN C) 1000 MG tablet Take 1 tablet by mouth daily.  . Biotin 1 MG CAPS Take 1 capsule by mouth daily.  . Cholecalciferol (VITAMIN D3) 125 MCG (5000 UT) CAPS Take 1 capsule by mouth daily.   Marland Kitchen CRANBERRY EXTRACT PO Take 1 tablet by mouth daily.  . cyanocobalamin (,VITAMIN B-12,) 1000 MCG/ML injection Inject 1,000 mcg into the muscle every 14 (fourteen) days.   Marland Kitchen denosumab (PROLIA) 60 MG/ML SOLN injection Inject 60 mg into the skin every 6 (six) months.  . Dextran 70-Hypromellose (ARTIFICIAL TEARS PF OP) Place 1 drop into both eyes 3 (three) times daily.  Tery Sanfilippo Calcium (STOOL SOFTENER PO) Take by mouth daily as needed.  . fexofenadine (ALLEGRA) 180 MG tablet Take 180 mg by mouth daily.  . furosemide (LASIX) 20 MG tablet Take 1 tablet (20 mg total) by mouth daily.  . Garlic 1500 MG CAPS Take 1,500 mg by mouth daily.  . Multiple Vitamin (MULTIVITAMIN) tablet Take 1 tablet by mouth daily.  . naproxen sodium (ALEVE) 220 MG tablet Take 220 mg by mouth daily as needed.  . Omega-3 Fatty Acids (OMEGA-3 FISH OIL PO) Take 1 capsule by mouth daily.  . polyethylene glycol powder (GLYCOLAX/MIRALAX) 17 GM/SCOOP powder Take by mouth daily as needed.  . primidone (MYSOLINE) 250 MG tablet Take 250 mg by mouth in the morning and at bedtime.  . Probiotic Product (PROBIOTIC DAILY PO) Take 1 tablet by mouth daily.  . Red Yeast Rice Extract 600 MG CAPS Take 1 capsule by mouth daily.  . sacubitril-valsartan (ENTRESTO) 49-51 MG Take 1 tablet by mouth 2 (two) times daily.  . SENNA PO Take by mouth daily as needed.  . traMADol (ULTRAM) 50 MG tablet Take 50 mg by mouth every 6 (six) hours as needed for moderate pain.   . TURMERIC PO Take 1 tablet by mouth 2 (two) times daily.  Marland Kitchen umeclidinium-vilanterol (ANORO ELLIPTA) 62.5-25 MCG/INH AEPB Inhale 1 puff into the lungs daily.  . vitamin E 1000 UNIT capsule  Take 1 capsule by mouth daily.     Allergies:   Lidocaine hcl, Procaine, Propranolol, Codeine, Epinephrine, Gabapentin, Levofloxacin, Other, Latex, and Sulfamethoxazole   Social History   Socioeconomic History  . Marital status: Divorced    Spouse name: Not on file  . Number of children: Not on file  . Years of education: Not on file  . Highest education level: Not on file  Occupational History  . Not  on file  Tobacco Use  . Smoking status: Former Smoker    Packs/day: 1.00    Years: 35.00    Pack years: 35.00    Types: Cigarettes    Quit date: 10/22/1985    Years since quitting: 34.8  . Smokeless tobacco: Never Used  Vaping Use  . Vaping Use: Never used  Substance and Sexual Activity  . Alcohol use: No  . Drug use: No  . Sexual activity: Not on file  Other Topics Concern  . Not on file  Social History Narrative  . Not on file   Social Determinants of Health   Financial Resource Strain:   . Difficulty of Paying Living Expenses: Not on file  Food Insecurity:   . Worried About Programme researcher, broadcasting/film/video in the Last Year: Not on file  . Ran Out of Food in the Last Year: Not on file  Transportation Needs:   . Lack of Transportation (Medical): Not on file  . Lack of Transportation (Non-Medical): Not on file  Physical Activity:   . Days of Exercise per Week: Not on file  . Minutes of Exercise per Session: Not on file  Stress:   . Feeling of Stress : Not on file  Social Connections:   . Frequency of Communication with Friends and Family: Not on file  . Frequency of Social Gatherings with Friends and Family: Not on file  . Attends Religious Services: Not on file  . Active Member of Clubs or Organizations: Not on file  . Attends Banker Meetings: Not on file  . Marital Status: Not on file     Family History: The patient's family history includes ALS in her mother; Stroke in her sister; Throat cancer in her sister; Ulcers in her father. ROS:   Please see the  history of present illness.    All other systems reviewed and are negative.  EKGs/Labs/Other Studies Reviewed:    The following studies were reviewed today:   Physical Exam:    VS:  BP (!) 147/78   Pulse 80   Ht 5\' 6"  (1.676 m)   Wt 134 lb (60.8 kg)   SpO2 90%   BMI 21.63 kg/m     Wt Readings from Last 3 Encounters:  09/01/20 134 lb (60.8 kg)  07/19/20 131 lb (59.4 kg)  06/17/20 131 lb (59.4 kg)     GEN:  Well nourished, well developed in no acute distress HEENT: Normal NECK: No JVD; No carotid bruits LYMPHATICS: No lymphadenopathy CARDIAC: RRR, no murmurs, rubs, gallops RESPIRATORY:  Clear to auscultation without rales, wheezing or rhonchi  ABDOMEN: Soft, non-tender, non-distended MUSCULOSKELETAL:  No edema; No deformity  SKIN: Warm and dry NEUROLOGIC:  Alert and oriented x 3 PSYCHIATRIC:  Normal affect    Signed, 06/19/20, MD  09/01/2020 11:09 AM    [facility]

## 2020-09-01 NOTE — Patient Instructions (Signed)

## 2020-09-16 LAB — CUP PACEART REMOTE DEVICE CHECK
Battery Impedance: 3403 Ohm
Battery Remaining Longevity: 18 mo
Battery Voltage: 2.72 V
Brady Statistic RV Percent Paced: 100 %
Date Time Interrogation Session: 20211126085816
Implantable Lead Implant Date: 20050802
Implantable Lead Location: 753860
Implantable Lead Model: 4076
Implantable Pulse Generator Implant Date: 20140717
Lead Channel Impedance Value: 0 Ohm
Lead Channel Impedance Value: 605 Ohm
Lead Channel Pacing Threshold Amplitude: 0.625 V
Lead Channel Pacing Threshold Pulse Width: 0.4 ms
Lead Channel Setting Pacing Amplitude: 2.5 V
Lead Channel Setting Pacing Pulse Width: 0.4 ms
Lead Channel Setting Sensing Sensitivity: 4 mV

## 2020-09-19 ENCOUNTER — Ambulatory Visit (INDEPENDENT_AMBULATORY_CARE_PROVIDER_SITE_OTHER): Payer: Medicare Other

## 2020-09-19 DIAGNOSIS — I482 Chronic atrial fibrillation, unspecified: Secondary | ICD-10-CM

## 2020-09-26 NOTE — Progress Notes (Signed)
Remote pacemaker transmission.   

## 2020-10-04 DIAGNOSIS — R5383 Other fatigue: Secondary | ICD-10-CM | POA: Insufficient documentation

## 2020-10-04 DIAGNOSIS — H8113 Benign paroxysmal vertigo, bilateral: Secondary | ICD-10-CM | POA: Insufficient documentation

## 2020-10-04 DIAGNOSIS — R5381 Other malaise: Secondary | ICD-10-CM | POA: Insufficient documentation

## 2020-10-04 HISTORY — DX: Other malaise: R53.81

## 2020-10-19 DIAGNOSIS — N390 Urinary tract infection, site not specified: Secondary | ICD-10-CM | POA: Insufficient documentation

## 2020-11-09 ENCOUNTER — Other Ambulatory Visit: Payer: Self-pay | Admitting: Cardiology

## 2020-11-09 NOTE — Telephone Encounter (Signed)
Rx refill sent to pharmacy. 

## 2020-11-22 DIAGNOSIS — M25562 Pain in left knee: Secondary | ICD-10-CM | POA: Insufficient documentation

## 2020-11-22 DIAGNOSIS — G8929 Other chronic pain: Secondary | ICD-10-CM

## 2020-11-22 HISTORY — DX: Other chronic pain: G89.29

## 2020-12-01 NOTE — Progress Notes (Signed)
Cardiology Office Note:    Date:  12/02/2020   ID:  Kimberly Raymond, DOB [birth year], MRN [id number]  PCP:  Gordan Payment., MD  Cardiologist:  Norman Herrlich, MD    Referring MD: Gordan Payment., MD    ASSESSMENT:    1. Hypertensive heart disease with heart failure (HCC)   2. Chronic atrial fibrillation (HCC)   3. Pacemaker    PLAN:    In order of problems listed above:  1. Stable heart failure is compensated with her frailty had continue her current loop diuretic along with low-dose Entresto I would not uptitrate.  She is not on a beta-blocker with COPD 2. Rate controlled not anticoagulated at her decision 3. Stable followed in our device clinic she is 100% pacemaker dependent   Next appointment: 3 months   Medication Adjustments/Labs and Tests Ordered: Current medicines are reviewed at length with the patient today.  Concerns regarding medicines are outlined above.  No orders of the defined types were placed in this encounter.  No orders of the defined types were placed in this encounter.   Chief Complaint  Patient presents with  . Follow-up    History of Present Illness:    Kimberly Raymond is a 87 y.o. female with a hx of chronic atrial fibrillation with AV nodal ablation for rate control and permanent pacemaker COPD sleep apnea and hypertensive heart disease with mildly reduced ejection fraction of 45 to 50% and heart failure.  She was last seen 09/01/2020 with improvement after treatment for heart failure and initiation of Entresto.. Compliance with diet, lifestyle and medications: Yes  Last device check 09/16/2020 showed normal parameters function 100% RV paced with an expected battery longevity 1/2 years. Most recent ejection fraction 02/04/1999 2145 to 50%.  She also has moderate right ventricular enlargement and mild right ventricular dysfunction in the setting of paced rhythm and COPD.  Her pulmonary artery pressure was significantly elevated in the range of 55 mmHg  by echo Doppler. Past Medical History:  Diagnosis Date  . Acute bronchitis, complicated 09/11/2018  . Age-related osteoporosis without current pathological fracture 05/18/2011   Last Assessment & Plan:  Relevant Hx: Course: Daily Update: Today's Plan:she is following with Dr. Albertha Ghee for this and recieves prolia twice a year and is due for this in May  Electronically signed by: [name], NP 02/16/16 2101  . Allergic rhinitis   . Amblyopia, right eye 11/08/2015  . Aortic atherosclerosis (HCC) 02/17/2018  . Arthritis 05/18/2011  . Branch retinal vein occlusion of left eye 11/08/2015  . Cardiac pacemaker in situ 05/18/2011  . Cerebral thrombosis with cerebral infarction (HCC) 05/18/2011  . Chronic atrial fibrillation (HCC) 11/22/2015   Overview:  With His ablation and permanent pacemaker for rate control, CHADS2 vasc score= 7  Overview:  Overview:  With His ablation and permanent pacemaker for rate control, CHADS2 vasc score= 7  Last Assessment & Plan:  Relevant Hx: Course: Daily Update: Today's Plan:she has pacemaker and is no longer taking eliquis   Electronically signed by: [name], NP 02/16/16 2059  . Chronic bilateral back pain 08/14/2017  . Chronic diastolic heart failure (HCC) 11/22/2015  . Chronic edema 04/01/2017  . Chronic obstructive pulmonary disease (HCC) 10/18/2016  . Chronic pansinusitis 05/22/2018  . Chronic respiratory failure with hypoxia (HCC) 03/06/2018  . Chronic rhinitis 05/22/2018  . Contracture of finger joint, left 10/30/2016  . COPD (chronic obstructive pulmonary disease) (HCC)   . Coronary artery calcification seen  on CAT scan 04/01/2018   Seen on CT scan March 2019 with three-vessel calcification  . Coronary artery disease involving native coronary artery of native heart with angina pectoris (HCC) 04/01/2018   Formatting of this note might be different from the original. Seen on CT scan March 2019 with three-vessel calcification  .  Costochondral chest pain 11/22/2015  . Dizziness 05/22/2016   Last Assessment & Plan:  She is going to have meclizine sent in for her and she is aware of the need to have safety due to sleepy effect  . Dupuytren's contracture of left hand 10/30/2016  . Epiretinal membrane (ERM) of left eye 11/08/2015  . Essential hypertension 02/01/2016   Last Assessment & Plan:  Relevant Hx: Course: Daily Update: Today's Plan:this is fair for her right now and with her being sick it is not going to be ideal right now will follow  Electronically signed by: [name], NP 02/16/16 2057  . Essential tremor 05/18/2011  . Extensor tendonitis of foot 12/25/2018  . Gait abnormality 08/15/2017  . High risk medication use 05/30/2017  . Hip joint pain 12/29/2013  . History of dizziness 05/22/2016   Last Assessment & Plan:  She is going to have meclizine sent in for her and she is aware of the need to have safety due to sleepy effect  . Hypertensive heart disease with heart failure (HCC) 11/22/2015  . Hypertensive retinopathy of both eyes 11/08/2015   Last Assessment & Plan:  Relevant Hx: Course: Daily Update: Today's Plan:she is following with specialist for this currently  Electronically signed by: [name], NP 02/16/16 2059  . Iron deficiency 05/22/2018  . Laryngopharyngeal reflux   . Left epiretinal membrane 06/26/2016  . Left foot pain 04/08/2014  . Lumbar radiculopathy, right 06/01/2020  . Mixed hyperlipidemia 02/01/2016   Last Assessment & Plan:  Relevant Hx: Course: Daily Update: Today's Plan:there is going to be fasting lipids done   Electronically signed by: [name], NP 02/16/16 2058  . Multiple falls 08/15/2017  . Nocturnal oxygen desaturation   . Non-rheumatic mitral regurgitation 01/08/2016   Overview:  Mild  . Osteoporosis 05/18/2011  . Other dietary vitamin B12 deficiency anemia 04/15/2018  . Other spondylosis with radiculopathy, lumbar region 06/01/2020  . Pacemaker  05/18/2011   Manufacturer of Device: [employer]  Type of DeviceInsurance risk surveyor      . Posterior vitreous detachment of both eyes 06/26/2016  . Presence of cardiac pacemaker 05/18/2011  . Pressure injury of skin of dorsum of right foot 04/10/2018  . Pseudophakia of both eyes 11/08/2015  . PVD (posterior vitreous detachment), both eyes 11/08/2015  . Retinal hemorrhage of both eyes 06/26/2016  . Rib pain on right side 12/27/2017  . S/P revision of total knee 08/10/2013  . S/P total knee arthroplasty 12/29/2013  . Shortness of breath 12/30/2017  . Supplemental oxygen dependent 03/21/2018    Past Surgical History:  Procedure Laterality Date  . ABDOMINAL HYSTERECTOMY    . BACK SURGERY    . FEMUR FRACTURE SURGERY Right   . FINGER SURGERY Right    5 finger  . PACEMAKER INSERTION    . PR PART PALMAR FASCIEC,OPEN 1 DIGIT Left 10/30/2016     . REPLACEMENT TOTAL KNEE Right   . REVISION TOTAL KNEE ARTHROPLASTY Right   . SKIN GRAFT    . TONSILLECTOMY    . TUBAL LIGATION      Current Medications: Current Meds  Medication Sig  . acetaminophen (  TYLENOL) 325 MG tablet Take 650 mg by mouth every 6 (six) hours as needed for mild pain.  Marland Kitchen albuterol (PROVENTIL HFA;VENTOLIN HFA) 108 (90 BASE) MCG/ACT inhaler Inhale 2 puffs into the lungs every 4 (four) hours as needed for wheezing or shortness of breath.  . Ascorbic Acid (VITAMIN C) 1000 MG tablet Take 1 tablet by mouth daily.  . Biotin 1 MG CAPS Take 1 capsule by mouth daily.  . Cholecalciferol (VITAMIN D3) 125 MCG (5000 UT) CAPS Take 1 capsule by mouth daily.   Marland Kitchen CRANBERRY EXTRACT PO Take 1 tablet by mouth daily.  . cyanocobalamin (,VITAMIN B-12,) 1000 MCG/ML injection Inject 1,000 mcg into the muscle every 14 (fourteen) days.   Marland Kitchen denosumab (PROLIA) 60 MG/ML SOLN injection Inject 60 mg into the skin every 6 (six) months.  . Dextran 70-Hypromellose (ARTIFICIAL TEARS PF OP) Place 1 drop into both eyes 3 (three) times daily.  Tery Sanfilippo Calcium (STOOL  SOFTENER PO) Take 2 tablets by mouth daily.  Marland Kitchen ENTRESTO 49-51 MG Take 1 tablet by mouth 2 (two) times daily.  . famotidine (PEPCID) 20 MG tablet Take 20 mg by mouth 2 (two) times daily.  . fexofenadine (ALLEGRA) 180 MG tablet Take 180 mg by mouth daily.  . furosemide (LASIX) 20 MG tablet Take 1 tablet (20 mg total) by mouth daily.  . Garlic 1500 MG CAPS Take 1,500 mg by mouth daily.  . Multiple Vitamin (MULTIVITAMIN) tablet Take 1 tablet by mouth daily.  . naproxen sodium (ALEVE) 220 MG tablet Take 220 mg by mouth daily as needed (pain).  . nystatin (MYCOSTATIN) 100000 UNIT/ML suspension Take 5 mLs by mouth 4 (four) times daily.  . Omega-3 Fatty Acids (OMEGA-3 FISH OIL PO) Take 1 capsule by mouth daily.  . polyethylene glycol powder (GLYCOLAX/MIRALAX) 17 GM/SCOOP powder Take 1 Container by mouth daily as needed for mild constipation.  . primidone (MYSOLINE) 250 MG tablet Take 250 mg by mouth in the morning and at bedtime.  . Probiotic Product (PROBIOTIC DAILY PO) Take 1 tablet by mouth daily.  . Red Yeast Rice Extract 600 MG CAPS Take 1 capsule by mouth daily.  . traMADol (ULTRAM) 50 MG tablet Take 50 mg by mouth every 6 (six) hours as needed for moderate pain.   . TURMERIC PO Take 1 tablet by mouth 2 (two) times daily.  Marland Kitchen umeclidinium-vilanterol (ANORO ELLIPTA) 62.5-25 MCG/INH AEPB Inhale 1 puff into the lungs daily.  . vitamin E 1000 UNIT capsule Take 1 capsule by mouth daily.     Allergies:   Lidocaine hcl, Procaine, Propranolol, Codeine, Epinephrine, Gabapentin, Levofloxacin, Other, Latex, and Sulfamethoxazole   Social History   Socioeconomic History  . Marital status: Divorced    Spouse name: Not on file  . Number of children: Not on file  . Years of education: Not on file  . Highest education level: Not on file  Occupational History  . Not on file  Tobacco Use  . Smoking status: Former Smoker    Packs/day: 1.00    Years: 35.00    Pack years: 35.00    Types: Cigarettes     Quit date: 10/22/1985    Years since quitting: 35.1  . Smokeless tobacco: Never Used  Vaping Use  . Vaping Use: Never used  Substance and Sexual Activity  . Alcohol use: No  . Drug use: No  . Sexual activity: Not on file  Other Topics Concern  . Not on file  Social History Narrative  . Not  on file   Social Determinants of Health   Financial Resource Strain: Not on file  Food Insecurity: Not on file  Transportation Needs: Not on file  Physical Activity: Not on file  Stress: Not on file  Social Connections: Not on file     Family History: The patient's family history includes ALS in her mother; Stroke in her sister; Throat cancer in her sister; Ulcers in her father. ROS:   Please see the history of present illness.    All other systems reviewed and are negative.  EKGs/Labs/Other Studies Reviewed:    The following studies were reviewed today:   Recent Labs: 11/05/2019 [facility] PCP: Creatinine 0.54 potassium 3.9 sodium 141 cholesterol 158 LDL 83 triglyceride 65 HDL 56 06/17/2020: Hemoglobin 12.4; Platelets 186 07/19/2020: BUN 11; Creatinine, Ser 0.61; NT-Pro BNP 921; Potassium 4.4; Sodium 141  Recent Lipid Panel No results found for: CHOL, TRIG, HDL, CHOLHDL, VLDL, LDLCALC, LDLDIRECT  Physical Exam:    VS:  BP 130/64 (BP Location: Left Arm, Patient Position: Sitting, Cuff Size: Normal)   Pulse 74   Ht 5\' 6"  (1.676 m)   Wt 131 lb (59.4 kg)   SpO2 94%   BMI 21.14 kg/m     Wt Readings from Last 3 Encounters:  12/02/20 131 lb (59.4 kg)  09/01/20 134 lb (60.8 kg)  07/19/20 131 lb (59.4 kg)     GEN: She looks frail well nourished, well developed in no acute distress HEENT: Normal NECK: No JVD; No carotid bruits LYMPHATICS: No lymphadenopathy CARDIAC: RRR, no murmurs, rubs, gallops RESPIRATORY:  Clear to auscultation without rales, wheezing or rhonchi  ABDOMEN: Soft, non-tender, non-distended MUSCULOSKELETAL:  No edema; No deformity  SKIN: Warm and  dry NEUROLOGIC:  Alert and oriented x 3 PSYCHIATRIC:  Normal affect    Signed, Norman Herrlich, MD  12/02/2020 1:56 PM    [facility]

## 2020-12-02 ENCOUNTER — Ambulatory Visit (INDEPENDENT_AMBULATORY_CARE_PROVIDER_SITE_OTHER): Payer: Medicare Other | Admitting: Cardiology

## 2020-12-02 ENCOUNTER — Encounter: Payer: Self-pay | Admitting: Cardiology

## 2020-12-02 ENCOUNTER — Other Ambulatory Visit: Payer: Self-pay

## 2020-12-02 VITALS — BP 130/64 | HR 74 | Ht 66.0 in | Wt 131.0 lb

## 2020-12-02 DIAGNOSIS — I110 Hypertensive heart disease with heart failure: Secondary | ICD-10-CM

## 2020-12-02 DIAGNOSIS — Z950 Presence of cardiac pacemaker: Secondary | ICD-10-CM | POA: Diagnosis not present

## 2020-12-02 DIAGNOSIS — I482 Chronic atrial fibrillation, unspecified: Secondary | ICD-10-CM | POA: Diagnosis not present

## 2020-12-02 NOTE — Patient Instructions (Signed)

## 2020-12-16 DIAGNOSIS — M47816 Spondylosis without myelopathy or radiculopathy, lumbar region: Secondary | ICD-10-CM | POA: Insufficient documentation

## 2020-12-16 DIAGNOSIS — M7631 Iliotibial band syndrome, right leg: Secondary | ICD-10-CM | POA: Insufficient documentation

## 2020-12-16 HISTORY — DX: Spondylosis without myelopathy or radiculopathy, lumbar region: M47.816

## 2020-12-19 ENCOUNTER — Ambulatory Visit (INDEPENDENT_AMBULATORY_CARE_PROVIDER_SITE_OTHER): Payer: Medicare Other

## 2020-12-19 DIAGNOSIS — Z950 Presence of cardiac pacemaker: Secondary | ICD-10-CM

## 2020-12-21 LAB — CUP PACEART REMOTE DEVICE CHECK
Battery Impedance: 3549 Ohm
Battery Remaining Longevity: 16 mo
Battery Voltage: 2.71 V
Brady Statistic RV Percent Paced: 100 %
Date Time Interrogation Session: 20220228102533
Implantable Lead Implant Date: 20050802
Implantable Lead Location: 753860
Implantable Lead Model: 4076
Implantable Pulse Generator Implant Date: 20140717
Lead Channel Impedance Value: 0 Ohm
Lead Channel Impedance Value: 562 Ohm
Lead Channel Pacing Threshold Amplitude: 0.625 V
Lead Channel Pacing Threshold Pulse Width: 0.4 ms
Lead Channel Setting Pacing Amplitude: 2.5 V
Lead Channel Setting Pacing Pulse Width: 0.4 ms
Lead Channel Setting Sensing Sensitivity: 4 mV

## 2020-12-27 NOTE — Progress Notes (Signed)
Remote pacemaker transmission.   

## 2021-03-01 ENCOUNTER — Other Ambulatory Visit: Payer: Self-pay | Admitting: Cardiology

## 2021-03-09 ENCOUNTER — Encounter: Payer: Self-pay | Admitting: Cardiology

## 2021-03-09 ENCOUNTER — Other Ambulatory Visit: Payer: Self-pay

## 2021-03-09 ENCOUNTER — Ambulatory Visit (INDEPENDENT_AMBULATORY_CARE_PROVIDER_SITE_OTHER): Payer: Medicare Other | Admitting: Cardiology

## 2021-03-09 VITALS — BP 108/56 | HR 76 | Ht 66.0 in | Wt 132.4 lb

## 2021-03-09 DIAGNOSIS — I110 Hypertensive heart disease with heart failure: Secondary | ICD-10-CM

## 2021-03-09 DIAGNOSIS — I482 Chronic atrial fibrillation, unspecified: Secondary | ICD-10-CM

## 2021-03-09 DIAGNOSIS — Z950 Presence of cardiac pacemaker: Secondary | ICD-10-CM | POA: Diagnosis not present

## 2021-03-09 NOTE — Progress Notes (Signed)
Cardiology Office Note:    Date:  03/09/2021   ID:  Kimberly Raymond, DOB 03-Oct-1934, MRN [id number]  PCP:  Gordan Payment., MD  Cardiologist:  Norman Herrlich, MD    Referring MD: Gordan Payment., MD    ASSESSMENT:    1. Chronic atrial fibrillation (HCC)   2. Pacemaker   3. Hypertensive heart disease with heart failure (HCC)    PLAN:    In order of problems listed above:  1. From a cardiac perspective she is doing well her rate is controlled with a permanent pacemaker and her ejection fraction is normalized with guideline directed therapy Entresto heart failure is compensated and she is not anticoagulated in her personal decision. 2. Stable continue current treatment she has no edema or fluid overload continue Entresto and her current loop diuretic 3. She tells me she is having eye surgery for ptosis and is having EGD and dilation I think she is stable for both procedures.   Next appointment: 6 months   Medication Adjustments/Labs and Tests Ordered: Current medicines are reviewed at length with the patient today.  Concerns regarding medicines are outlined above.  No orders of the defined types were placed in this encounter.  No orders of the defined types were placed in this encounter.   Chief Complaint  Patient presents with  . Follow-up  . Atrial Fibrillation  . Congestive Heart Failure    History of Present Illness:    Kimberly Raymond is a 87 y.o. female with a hx of chronic atrial fibrillation with AV nodal ablation for rate control with pacemaker COPD sleep apnea and hypertensive heart disease with reduced ejection fraction 45 to 50% and heart failure.  She was last seen 12/02/2020.  Compliance with diet, lifestyle and medications: Yes  Her chest pain was clearly related to swallowing occurred with a meal she has had previous esophageal stricture and is waiting to see GI and think she needs dilation. She continues to do well with heart failure no edema shortness of  breath orthopnea exertional chest pain no awareness of atrial fibrillation.  She was admitted [facility] from the ED with chest pain self for 11/2020 that was felt to be noncardiac.  Her rhythm was paced troponin high-sensitivity was normal CTA showed no abnormality of pulmonary embolism but did show hepatic cirrhosis with portal venous hypertension and a small amount of ascitic fluid.  She was felt to have musculoskeletal chest wall pain.  Echocardiogram showed EF 65 to 70% improved with Entresto therapy.  Most recent labs [facility] 01/25/2021 hemoglobin 11.4 platelets 194,000 potassium 4.2 creatinine 0.53 GFR greater than 90 cc magnesium 2.5 Past Medical History:  Diagnosis Date  . Acute bronchitis, complicated 09/11/2018  . Age-related osteoporosis without current pathological fracture 05/18/2011   Last Assessment & Plan:  Relevant Hx: Course: Daily Update: Today's Plan:she is following with Dr. Albertha Ghee for this and recieves prolia twice a year and is due for this in May  Electronically signed by: [name], NP 02/16/16 2101  . Allergic rhinitis   . Amblyopia, right eye 11/08/2015  . Aortic atherosclerosis (HCC) 02/17/2018  . Arthritis 05/18/2011  . Branch retinal vein occlusion of left eye 11/08/2015  . Cardiac pacemaker in situ 05/18/2011  . Cerebral thrombosis with cerebral infarction (HCC) 05/18/2011  . Chronic atrial fibrillation (HCC) 11/22/2015   Overview:  With His ablation and permanent pacemaker for rate control, CHADS2 vasc score= 7  Overview:  Overview:  With  His ablation and permanent pacemaker for rate control, CHADS2 vasc score= 7  Last Assessment & Plan:  Relevant Hx: Course: Daily Update: Today's Plan:she has pacemaker and is no longer taking eliquis   Electronically signed by: [name], NP 02/16/16 2059  . Chronic bilateral back pain 08/14/2017  . Chronic diastolic heart failure (HCC) 11/22/2015  . Chronic edema 04/01/2017   . Chronic obstructive pulmonary disease (HCC) 10/18/2016  . Chronic pansinusitis 05/22/2018  . Chronic respiratory failure with hypoxia (HCC) 03/06/2018  . Chronic rhinitis 05/22/2018  . Contracture of finger joint, left 10/30/2016  . COPD (chronic obstructive pulmonary disease) (HCC)   . Coronary artery calcification seen on CAT scan 04/01/2018   Seen on CT scan March 2019 with three-vessel calcification  . Coronary artery disease involving native coronary artery of native heart with angina pectoris (HCC) 04/01/2018   Formatting of this note might be different from the original. Seen on CT scan March 2019 with three-vessel calcification  . Costochondral chest pain 11/22/2015  . Dizziness 05/22/2016   Last Assessment & Plan:  She is going to have meclizine sent in for her and she is aware of the need to have safety due to sleepy effect  . Dupuytren's contracture of left hand 10/30/2016  . Epiretinal membrane (ERM) of left eye 11/08/2015  . Essential hypertension 02/01/2016   Last Assessment & Plan:  Relevant Hx: Course: Daily Update: Today's Plan:this is fair for her right now and with her being sick it is not going to be ideal right now will follow  Electronically signed by: [name], NP 02/16/16 2057  . Essential tremor 05/18/2011  . Extensor tendonitis of foot 12/25/2018  . Gait abnormality 08/15/2017  . High risk medication use 05/30/2017  . Hip joint pain 12/29/2013  . History of dizziness 05/22/2016   Last Assessment & Plan:  She is going to have meclizine sent in for her and she is aware of the need to have safety due to sleepy effect  . Hypertensive heart disease with heart failure (HCC) 11/22/2015  . Hypertensive retinopathy of both eyes 11/08/2015   Last Assessment & Plan:  Relevant Hx: Course: Daily Update: Today's Plan:she is following with specialist for this currently  Electronically signed by: [name], NP 02/16/16 2059  . Iron deficiency 05/22/2018  .  Laryngopharyngeal reflux   . Left epiretinal membrane 06/26/2016  . Left foot pain 04/08/2014  . Lumbar radiculopathy, right 06/01/2020  . Mixed hyperlipidemia 02/01/2016   Last Assessment & Plan:  Relevant Hx: Course: Daily Update: Today's Plan:there is going to be fasting lipids done   Electronically signed by: [name], NP 02/16/16 2058  . Multiple falls 08/15/2017  . Nocturnal oxygen desaturation   . Non-rheumatic mitral regurgitation 01/08/2016   Overview:  Mild  . Osteoporosis 05/18/2011  . Other dietary vitamin B12 deficiency anemia 04/15/2018  . Other spondylosis with radiculopathy, lumbar region 06/01/2020  . Pacemaker 05/18/2011   Manufacturer of Device: [employer]  Type of DeviceInsurance risk surveyor      . Posterior vitreous detachment of both eyes 06/26/2016  . Presence of cardiac pacemaker 05/18/2011  . Pressure injury of skin of dorsum of right foot 04/10/2018  . Pseudophakia of both eyes 11/08/2015  . PVD (posterior vitreous detachment), both eyes 11/08/2015  . Retinal hemorrhage of both eyes 06/26/2016  . Rib pain on right side 12/27/2017  . S/P revision of total knee 08/10/2013  . S/P total knee arthroplasty 12/29/2013  .  Shortness of breath 12/30/2017  . Supplemental oxygen dependent 03/21/2018    Past Surgical History:  Procedure Laterality Date  . ABDOMINAL HYSTERECTOMY    . BACK SURGERY    . FEMUR FRACTURE SURGERY Right   . FINGER SURGERY Right    5 finger  . PACEMAKER INSERTION    . PR PART PALMAR FASCIEC,OPEN 1 DIGIT Left 10/30/2016     . REPLACEMENT TOTAL KNEE Right   . REVISION TOTAL KNEE ARTHROPLASTY Right   . SKIN GRAFT    . TONSILLECTOMY    . TUBAL LIGATION      Current Medications: Current Meds  Medication Sig  . acetaminophen (TYLENOL) 325 MG tablet Take 650 mg by mouth every 6 (six) hours as needed for mild pain.  Marland Kitchen. albuterol (PROVENTIL HFA;VENTOLIN HFA) 108 (90 BASE) MCG/ACT inhaler Inhale 2 puffs into the lungs every 4 (four) hours as  needed for wheezing or shortness of breath.  . Ascorbic Acid (VITAMIN C) 1000 MG tablet Take 1 tablet by mouth daily.  . Biotin 1 MG CAPS Take 1 capsule by mouth daily.  . Cholecalciferol (VITAMIN D3) 125 MCG (5000 UT) CAPS Take 1 capsule by mouth daily.   Marland Kitchen. CRANBERRY EXTRACT PO Take 1 tablet by mouth daily.  . cyanocobalamin (,VITAMIN B-12,) 1000 MCG/ML injection Inject 1,000 mcg into the muscle every 14 (fourteen) days.   Marland Kitchen. denosumab (PROLIA) 60 MG/ML SOLN injection Inject 60 mg into the skin every 6 (six) months.  . Dextran 70-Hypromellose (ARTIFICIAL TEARS PF OP) Place 1 drop into both eyes 3 (three) times daily.  Tery Sanfilippo. Docusate Calcium (STOOL SOFTENER PO) Take 2 tablets by mouth daily.  Marland Kitchen. ENTRESTO 49-51 MG Take 1 tablet by mouth 2 (two) times daily.  . famotidine (PEPCID) 20 MG tablet Take 20 mg by mouth 2 (two) times daily.  . fexofenadine (ALLEGRA) 180 MG tablet Take 180 mg by mouth daily.  . Garlic 1500 MG CAPS Take 1,500 mg by mouth daily.  . Multiple Vitamin (MULTIVITAMIN) tablet Take 1 tablet by mouth daily.  . naproxen sodium (ALEVE) 220 MG tablet Take 220 mg by mouth daily as needed (pain).  . Omega-3 Fatty Acids (OMEGA-3 FISH OIL PO) Take 1 capsule by mouth daily.  Marland Kitchen. omeprazole (PRILOSEC) 40 MG capsule Take 1 capsule by mouth every morning.  . polyethylene glycol powder (GLYCOLAX/MIRALAX) 17 GM/SCOOP powder Take 1 Container by mouth daily as needed for mild constipation.  . primidone (MYSOLINE) 250 MG tablet Take 250 mg by mouth in the morning and at bedtime.  . Probiotic Product (PROBIOTIC DAILY PO) Take 1 tablet by mouth daily.  . Red Yeast Rice Extract 600 MG CAPS Take 1 capsule by mouth daily.  . traMADol (ULTRAM) 50 MG tablet Take 50 mg by mouth every 6 (six) hours as needed for moderate pain.   . TURMERIC PO Take 1 tablet by mouth 2 (two) times daily.  Marland Kitchen. umeclidinium-vilanterol (ANORO ELLIPTA) 62.5-25 MCG/INH AEPB Inhale 1 puff into the lungs daily.  . vitamin E 1000 UNIT  capsule Take 1 capsule by mouth daily.     Allergies:   Lidocaine hcl, Procaine, Propranolol, Codeine, Epinephrine, Gabapentin, Levofloxacin, Other, Latex, and Sulfamethoxazole   Social History   Socioeconomic History  . Marital status: Divorced    Spouse name: Not on file  . Number of children: Not on file  . Years of education: Not on file  . Highest education level: Not on file  Occupational History  . Not on file  Tobacco Use  . Smoking status: Former Smoker    Packs/day: 1.00    Years: 35.00    Pack years: 35.00    Types: Cigarettes    Quit date: 10/22/1985    Years since quitting: 35.4  . Smokeless tobacco: Never Used  Vaping Use  . Vaping Use: Never used  Substance and Sexual Activity  . Alcohol use: No  . Drug use: No  . Sexual activity: Not on file  Other Topics Concern  . Not on file  Social History Narrative  . Not on file   Social Determinants of Health   Financial Resource Strain: Not on file  Food Insecurity: Not on file  Transportation Needs: Not on file  Physical Activity: Not on file  Stress: Not on file  Social Connections: Not on file     Family History: The patient's family history includes ALS in her mother; Stroke in her sister; Throat cancer in her sister; Ulcers in her father. ROS:   Please see the history of present illness.    All other systems reviewed and are negative.  EKGs/Labs/Other Studies Reviewed:    The following studies were reviewed today:  EKG: Performed [facility] 01/21/2021 showed atrial fibrillation and ventricular rate paced rhythm  Recent Labs: 06/17/2020: Hemoglobin 12.4; Platelets 186 07/19/2020: BUN 11; Creatinine, Ser 0.61; NT-Pro BNP 921; Potassium 4.4; Sodium 141  Recent Lipid Panel No results found for: CHOL, TRIG, HDL, CHOLHDL, VLDL, LDLCALC, LDLDIRECT  Physical Exam:    VS:  BP (!) 108/56   Pulse 76   Ht 5\' 6"  (1.676 m)   Wt 132 lb 6.4 oz (60.1 kg)   BMI 21.37 kg/m     Wt Readings from  Last 3 Encounters:  03/09/21 132 lb 6.4 oz (60.1 kg)  12/02/20 131 lb (59.4 kg)  09/01/20 134 lb (60.8 kg)     GEN: She looks frail well nourished, well developed in no acute distress HEENT: Normal NECK: No JVD; No carotid bruits LYMPHATICS: No lymphadenopathy CARDIAC: No chest wall tenderness RRR, no murmurs, rubs, gallops RESPIRATORY:  Clear to auscultation without rales, wheezing or rhonchi diffusely diminished breath sounds ABDOMEN: Soft, non-tender, non-distended MUSCULOSKELETAL:  No edema; No deformity  SKIN: Warm and dry NEUROLOGIC:  Alert and oriented x 3 PSYCHIATRIC:  Normal affect    Signed, 13/11/21, MD  03/09/2021 1:41 PM    [facility]

## 2021-03-09 NOTE — Patient Instructions (Signed)

## 2021-03-21 ENCOUNTER — Ambulatory Visit (INDEPENDENT_AMBULATORY_CARE_PROVIDER_SITE_OTHER): Payer: Medicare Other

## 2021-03-21 DIAGNOSIS — I482 Chronic atrial fibrillation, unspecified: Secondary | ICD-10-CM | POA: Diagnosis not present

## 2021-03-21 LAB — CUP PACEART REMOTE DEVICE CHECK
Battery Impedance: 3993 Ohm
Battery Remaining Longevity: 13 mo
Battery Voltage: 2.7 V
Brady Statistic RV Percent Paced: 100 %
Date Time Interrogation Session: 20220531101127
Implantable Lead Implant Date: 20050802
Implantable Lead Location: 753860
Implantable Lead Model: 4076
Implantable Pulse Generator Implant Date: 20140717
Lead Channel Impedance Value: 0 Ohm
Lead Channel Impedance Value: 580 Ohm
Lead Channel Pacing Threshold Amplitude: 0.625 V
Lead Channel Pacing Threshold Pulse Width: 0.4 ms
Lead Channel Setting Pacing Amplitude: 2.5 V
Lead Channel Setting Pacing Pulse Width: 0.4 ms
Lead Channel Setting Sensing Sensitivity: 4 mV

## 2021-04-13 NOTE — Progress Notes (Signed)
Remote pacemaker transmission.   

## 2021-05-16 ENCOUNTER — Other Ambulatory Visit: Payer: Self-pay | Admitting: Cardiology

## 2021-06-05 ENCOUNTER — Encounter: Payer: Medicare Other | Admitting: Cardiology

## 2021-06-08 DIAGNOSIS — Z8616 Personal history of COVID-19: Secondary | ICD-10-CM | POA: Insufficient documentation

## 2021-06-08 HISTORY — DX: Personal history of COVID-19: Z86.16

## 2021-06-19 ENCOUNTER — Telehealth: Payer: Self-pay

## 2021-06-19 ENCOUNTER — Ambulatory Visit (INDEPENDENT_AMBULATORY_CARE_PROVIDER_SITE_OTHER): Payer: Medicare Other

## 2021-06-19 DIAGNOSIS — I482 Chronic atrial fibrillation, unspecified: Secondary | ICD-10-CM

## 2021-06-19 LAB — CUP PACEART REMOTE DEVICE CHECK
Battery Impedance: 4320 Ohm
Battery Remaining Longevity: 11 mo
Battery Voltage: 2.71 V
Brady Statistic RV Percent Paced: 100 %
Date Time Interrogation Session: 20220829110756
Implantable Lead Implant Date: 20050802
Implantable Lead Location: 753860
Implantable Lead Model: 4076
Implantable Pulse Generator Implant Date: 20140717
Lead Channel Impedance Value: 0 Ohm
Lead Channel Impedance Value: 569 Ohm
Lead Channel Pacing Threshold Amplitude: 0.625 V
Lead Channel Pacing Threshold Pulse Width: 0.4 ms
Lead Channel Setting Pacing Amplitude: 2.5 V
Lead Channel Setting Pacing Pulse Width: 0.4 ms
Lead Channel Setting Sensing Sensitivity: 4 mV

## 2021-06-19 NOTE — Telephone Encounter (Signed)
The patient is receiving a new handheld in 7-10 business days. °

## 2021-07-03 NOTE — Progress Notes (Signed)
Remote pacemaker transmission.   

## 2021-07-17 ENCOUNTER — Telehealth: Payer: Self-pay | Admitting: Cardiology

## 2021-07-17 NOTE — Telephone Encounter (Signed)
FYI Advised pt's daughter to call 911 and go to the ED. Pt's sx very concerning of MI. Pt's daughter verbalized understanding and had no additional questions.

## 2021-07-17 NOTE — Telephone Encounter (Signed)
Pt c/o of Chest Pain: STAT if CP now or developed within 24 hours  1. Are you having CP right now? Chest pain mid sterum  2. Are you experiencing any other symptoms (ex. SOB, nausea, vomiting, sweating)? Vomit once, sob  3. How long have you been experiencing CP? This morning, started around 10  4. Is your CP continuous or coming and going? Coming and going, real nauseated  5. Have you taken Nitroglycerin? no ?

## 2021-07-20 DIAGNOSIS — K219 Gastro-esophageal reflux disease without esophagitis: Secondary | ICD-10-CM | POA: Insufficient documentation

## 2021-08-22 ENCOUNTER — Other Ambulatory Visit: Payer: Self-pay

## 2021-08-22 ENCOUNTER — Ambulatory Visit (INDEPENDENT_AMBULATORY_CARE_PROVIDER_SITE_OTHER): Payer: Medicare Other | Admitting: Sports Medicine

## 2021-08-22 ENCOUNTER — Ambulatory Visit (INDEPENDENT_AMBULATORY_CARE_PROVIDER_SITE_OTHER): Payer: Medicare Other

## 2021-08-22 DIAGNOSIS — M792 Neuralgia and neuritis, unspecified: Secondary | ICD-10-CM | POA: Diagnosis not present

## 2021-08-22 DIAGNOSIS — S9031XA Contusion of right foot, initial encounter: Secondary | ICD-10-CM | POA: Diagnosis not present

## 2021-08-22 DIAGNOSIS — I739 Peripheral vascular disease, unspecified: Secondary | ICD-10-CM

## 2021-08-22 DIAGNOSIS — M25572 Pain in left ankle and joints of left foot: Secondary | ICD-10-CM

## 2021-08-22 DIAGNOSIS — M779 Enthesopathy, unspecified: Secondary | ICD-10-CM | POA: Diagnosis not present

## 2021-08-22 DIAGNOSIS — G8929 Other chronic pain: Secondary | ICD-10-CM

## 2021-08-22 DIAGNOSIS — M79671 Pain in right foot: Secondary | ICD-10-CM

## 2021-08-22 NOTE — Patient Instructions (Signed)
Recommend OTC Tylenol as needed for pain Recommend to try topical voltaren 1 to 2 times daily as needed for pain to your foot Recommend to add on Alpha Lipoic Acid for your nerves in addition to the Vitamin B that you take or to get Nervive nerve relief supplement for your nerves can be purchased OTC at [facility]

## 2021-08-22 NOTE — Progress Notes (Signed)
Subjective: Kimberly Raymond is a 87 y.o. female patient who presents to office for evaluation of right foot> left lateral ankle pain. Patient complains of increased swelling and bruising after someone stepped on her foot about a week ago specifically Saturday before last states that there is some pain and discomfort across the top of the foot where the bruising is and there is also still pain which she has had for years at the plantar aspect of her right foot feels like she is stepping on a rock also states that occasionally when she is lying in bed gets some pain to the lateral ankle on the right.  Patient has tried Tylenol to help with pain and swelling.  Patient denies any other pedal complaints at this time.  Patient Active Problem List   Diagnosis Date Noted   GERD without esophagitis 07/20/2021   Personal history of COVID-19 06/08/2021   Lumbar facet joint syndrome 12/16/2020   Iliotibial band syndrome affecting lower leg, right 12/16/2020   Chronic pain of left knee 11/22/2020   Recurrent UTI 10/19/2020   BPV (benign positional vertigo), bilateral 10/04/2020   Malaise and fatigue 10/04/2020   Closed trochanteric fracture of left femur with routine healing 08/31/2020   Dermatochalasis of both upper eyelids 06/23/2020   Nocturnal oxygen desaturation    Laryngopharyngeal reflux    COPD (chronic obstructive pulmonary disease) (HCC)    Allergic rhinitis    Lumbar radiculopathy, right 06/01/2020   Other spondylosis with radiculopathy, lumbar region 06/01/2020   Extensor tendonitis of foot 12/25/2018   Acute bronchitis, complicated 09/11/2018   Chronic rhinitis 05/22/2018   Iron deficiency 05/22/2018   Chronic pansinusitis 05/22/2018   Other dietary vitamin B12 deficiency anemia 04/15/2018   Pressure injury of skin of dorsum of right foot 04/10/2018   Coronary artery calcification seen on CAT scan 04/01/2018   Coronary artery disease involving native coronary artery of native heart with  angina pectoris (HCC) 04/01/2018   Supplemental oxygen dependent 03/21/2018   Chronic respiratory failure with hypoxia (HCC) 03/06/2018   Aortic atherosclerosis (HCC) 02/17/2018   Shortness of breath 12/30/2017   Rib pain on right side 12/27/2017   Gait abnormality 08/15/2017   Multiple falls 08/15/2017   Chronic bilateral back pain 08/14/2017   High risk medication use 05/30/2017   Chronic edema 04/01/2017   Contracture of finger joint, left 10/30/2016   Dupuytren's contracture of left hand 10/30/2016   Chronic obstructive pulmonary disease (HCC) 10/18/2016   Left epiretinal membrane 06/26/2016   Posterior vitreous detachment of both eyes 06/26/2016   Retinal hemorrhage of both eyes 06/26/2016   Dizziness 05/22/2016   History of dizziness 05/22/2016   Mixed hyperlipidemia 02/01/2016   Essential hypertension 02/01/2016   Acute exacerbation of COPD with asthma (HCC) 02/01/2016   Non-rheumatic mitral regurgitation 01/08/2016   Chronic atrial fibrillation (HCC) 11/22/2015   Chronic diastolic heart failure (HCC) 11/22/2015   Costochondral chest pain 11/22/2015   Hypertensive heart disease with heart failure (HCC) 11/22/2015   Amblyopia, right eye 11/08/2015   Branch retinal vein occlusion of left eye 11/08/2015   Epiretinal membrane (ERM) of left eye 11/08/2015   Hypertensive retinopathy of both eyes 11/08/2015   Pseudophakia of both eyes 11/08/2015   PVD (posterior vitreous detachment), both eyes 11/08/2015   Left foot pain 04/08/2014   S/P total knee arthroplasty 12/29/2013   Hip joint pain 12/29/2013   S/P revision of total knee 08/10/2013   Age-related osteoporosis without current pathological fracture 05/18/2011  Arthritis 05/18/2011   Cardiac pacemaker in situ 05/18/2011   Cerebral thrombosis with cerebral infarction [facility]) 05/18/2011   Essential tremor 05/18/2011   Osteoporosis 05/18/2011   Pacemaker 05/18/2011   Presence of cardiac pacemaker 05/18/2011    Current  Outpatient Medications on File Prior to Visit  Medication Sig Dispense Refill   acetaminophen (TYLENOL) 325 MG tablet Take 650 mg by mouth every 6 (six) hours as needed for mild pain.     albuterol (PROVENTIL HFA;VENTOLIN HFA) 108 (90 BASE) MCG/ACT inhaler Inhale 2 puffs into the lungs every 4 (four) hours as needed for wheezing or shortness of breath.     Ascorbic Acid (VITAMIN C) 1000 MG tablet Take 1 tablet by mouth daily.     Biotin 1 MG CAPS Take 1 capsule by mouth daily.     Cholecalciferol (VITAMIN D3) 125 MCG (5000 UT) CAPS Take 1 capsule by mouth daily.      CRANBERRY EXTRACT PO Take 1 tablet by mouth daily.     cyanocobalamin (,VITAMIN B-12,) 1000 MCG/ML injection Inject 1,000 mcg into the muscle every 14 (fourteen) days.      denosumab (PROLIA) 60 MG/ML SOLN injection Inject 60 mg into the skin every 6 (six) months.     dexamethasone (DECADRON) 4 MG tablet Take 6 mg by mouth daily.     Dextran 70-Hypromellose (ARTIFICIAL TEARS PF OP) Place 1 drop into both eyes 3 (three) times daily.     Docusate Calcium (STOOL SOFTENER PO) Take 2 tablets by mouth daily.     ENTRESTO 49-51 MG Take 1 tablet by mouth 2 (two) times daily. 180 tablet 2   famotidine (PEPCID) 20 MG tablet Take 20 mg by mouth 2 (two) times daily.     fexofenadine (ALLEGRA) 180 MG tablet Take 180 mg by mouth daily.     furosemide (LASIX) 20 MG tablet Take 1 tablet (20 mg total) by mouth daily. 90 tablet 3   Garlic [id number] MG CAPS Take 1,500 mg by mouth daily.     levofloxacin (LEVAQUIN) 750 MG tablet Take 750 mg by mouth daily.     Multiple Vitamin (MULTIVITAMIN) tablet Take 1 tablet by mouth daily.     naproxen sodium (ALEVE) 220 MG tablet Take 220 mg by mouth daily as needed (pain).     Omega-3 Fatty Acids (OMEGA-3 FISH OIL PO) Take 1 capsule by mouth daily.     omeprazole (PRILOSEC) 40 MG capsule Take 1 capsule by mouth every morning.     ondansetron (ZOFRAN-ODT) 4 MG disintegrating tablet Take 4 mg by mouth every 8 (eight)  hours as needed.     polyethylene glycol powder (GLYCOLAX/MIRALAX) 17 GM/SCOOP powder Take 1 Container by mouth daily as needed for mild constipation.     predniSONE (DELTASONE) 10 MG tablet Take by mouth.     primidone (MYSOLINE) 250 MG tablet Take 250 mg by mouth in the morning and at bedtime.     Probiotic Product (PROBIOTIC DAILY PO) Take 1 tablet by mouth daily.     promethazine-dextromethorphan (PROMETHAZINE-DM) 6.25-15 MG/5ML syrup Take 5 mLs by mouth 4 (four) times daily as needed.     Red Yeast Rice Extract 600 MG CAPS Take 1 capsule by mouth daily.     topiramate (TOPAMAX) 25 MG tablet Take by mouth.     traMADol (ULTRAM) 50 MG tablet Take 50 mg by mouth every 6 (six) hours as needed for moderate pain.      TURMERIC PO Take 1 tablet by mouth  2 (two) times daily.     umeclidinium-vilanterol (ANORO ELLIPTA) 62.5-25 MCG/INH AEPB Inhale 1 puff into the lungs daily.     vitamin E 1000 UNIT capsule Take 1 capsule by mouth daily.     No current facility-administered medications on file prior to visit.    Allergies  Allergen Reactions   Lidocaine Hcl Shortness Of Breath   Procaine Shortness Of Breath    "in a dentist office" pt reports   Propranolol Shortness Of Breath   Codeine     Hallucinations   Epinephrine     Other reaction(s): Increased Heart Rate (intolerance)   Gabapentin Other (See Comments)    drowsiness   Levofloxacin     Other reaction(s): Malaise (intolerance)   Other Other (See Comments)    drowsiness   Latex Rash   Sulfamethoxazole Rash    Blisters in mouth    Objective:  General: Alert and oriented x3 in no acute distress  Dermatology: No open lesions bilateral lower extremities, no webspace macerations, significant ecchymosis to the dorsal right foot, all nails x 10 are well manicured.  Vascular: Dorsalis Pedis and Posterior Tibial pedal nonpalpable with capillary fill time less than 5 seconds temperature gradient decreased purple discoloration and severe  varicosities bilateral consistent with history of PVD  Neurology: Gross sensation intact via light touch bilateral, subjective diminished sensation on the right greater than left feels like a bunched up sock underneath her foot which has been unchanged from prior previous history of this since 2011  Musculoskeletal: Mild tenderness with palpation dorsal midfoot on the right, hammertoe deformity on right greater than left fat pad atrophy and prominent metatarsal heads, there is mild tenderness palpation to the dorsal lateral left foot and ankle no frank instability strength within normal limits for patient's age.  Gait: Cane assisted gait  Xrays  Right foot   Impression: Joint space narrowing at the midfoot consistent with arthritis, no fracture or dislocation noted, there is long second metatarsal with lesser hammertoe deformity noted  Assessment and Plan: Problem List Items Addressed This Visit   None Visit Diagnoses     PVD (peripheral vascular disease) (HCC)    -  Primary   Relevant Orders   DG Foot Complete Right   Capsulitis       Relevant Orders   DG Foot Complete Right   Contusion of right foot, initial encounter       Neuritis       Right foot pain       Chronic pain of left ankle       Relevant Medications   dexamethasone (DECADRON) 4 MG tablet   predniSONE (DELTASONE) 10 MG tablet   topiramate (TOPAMAX) 25 MG tablet          -Complete examination performed -Xrays reviewed -Discussed treatment options for right foot pain and contusion which is her primary pain today -Dispensed Surgigrip compression sleeve to use for edema control on the right -Advised elevation rest and a combination of heat and ice behind the knee as directed -Recommend continue with Tylenol as needed for pain -Advised patient to try topical Voltaren as needed for pain recommend stent placement age and thin skin to use no more than twice daily -Discussed treatment options for neuritis -Advised  patient to continue with vitamin B and may add on alpha lipoid acid or switch to using Nervive nerve relief supplement that she can get over-the-counter -Discussed treatment options for left foot and ankle pain -Advised patient to sleep  with a pillow since she only has the pain at night when she lays on that side -Advised patient to use topical Voltaren at the end of the day to the affected area and continue with Tylenol as needed for pain -Dispensed Surgigrip compression sleeve to allow for additional support to the foot and ankle area edema control on the left -Dispensed heel lifts for patient to use as directed -Patient to return to office in 1 month for follow-up on right foot pain and bruising/contusion or sooner if condition worsens.  Landis Martins, DPM

## 2021-08-24 DIAGNOSIS — H6123 Impacted cerumen, bilateral: Secondary | ICD-10-CM | POA: Insufficient documentation

## 2021-08-31 ENCOUNTER — Other Ambulatory Visit: Payer: Self-pay

## 2021-09-01 ENCOUNTER — Other Ambulatory Visit: Payer: Self-pay

## 2021-09-01 ENCOUNTER — Ambulatory Visit (INDEPENDENT_AMBULATORY_CARE_PROVIDER_SITE_OTHER): Payer: Medicare Other | Admitting: Cardiology

## 2021-09-01 ENCOUNTER — Encounter: Payer: Self-pay | Admitting: Cardiology

## 2021-09-01 VITALS — BP 160/88 | HR 82 | Ht 66.0 in | Wt 129.2 lb

## 2021-09-01 DIAGNOSIS — I482 Chronic atrial fibrillation, unspecified: Secondary | ICD-10-CM

## 2021-09-01 DIAGNOSIS — R188 Other ascites: Secondary | ICD-10-CM

## 2021-09-01 DIAGNOSIS — J449 Chronic obstructive pulmonary disease, unspecified: Secondary | ICD-10-CM | POA: Diagnosis not present

## 2021-09-01 DIAGNOSIS — K7460 Unspecified cirrhosis of liver: Secondary | ICD-10-CM

## 2021-09-01 DIAGNOSIS — Z950 Presence of cardiac pacemaker: Secondary | ICD-10-CM | POA: Diagnosis not present

## 2021-09-01 DIAGNOSIS — I110 Hypertensive heart disease with heart failure: Secondary | ICD-10-CM | POA: Diagnosis not present

## 2021-09-01 MED ORDER — FUROSEMIDE 20 MG PO TABS: ORAL_TABLET | ORAL | 12 refills | Status: DC

## 2021-09-01 NOTE — Patient Instructions (Signed)
Medication Instructions:  Your physician has recommended you make the following change in your medication:   Start Furosemide 20 mg twice daily for 1 week then take 1 daily alternating with twice daily.  *If you need a refill on your cardiac medications before your next appointment, please call your pharmacy*   Lab Work: Your physician recommends that you return for lab work in: 2 weeks You can come Monday through Friday 8:30 am to 12:00 pm and 1:15 to 4:30. You do not need to make an appointment as the order has already been placed. The labs you are going to have done are BMET and Pro BNP.  If you have labs (blood work) drawn today and your tests are completely normal, you will receive your results only by: MyChart Message (if you have MyChart) OR A paper copy in the mail If you have any lab test that is abnormal or we need to change your treatment, we will call you to review the results.   Testing/Procedures: None ordered   Follow-Up: At [facility], you and your health needs are our priority.  As part of our continuing mission to provide you with exceptional heart care, we have created designated Provider Care Teams.  These Care Teams include your primary Cardiologist (physician) and Advanced Practice Providers (APPs -  Physician Assistants and Nurse Practitioners) who all work together to provide you with the care you need, when you need it.  We recommend signing up for the patient portal called "MyChart".  Sign up information is provided on this After Visit Summary.  MyChart is used to connect with patients for Virtual Visits (Telemedicine).  Patients are able to view lab/test results, encounter notes, upcoming appointments, etc.  Non-urgent messages can be sent to your provider as well.   To learn more about what you can do with MyChart, go to [email or web address].    Your next appointment:   6 week(s)  The format for your next appointment:   In Person  Provider:    Norman Herrlich, MD   Other Instructions NA

## 2021-09-01 NOTE — Progress Notes (Signed)
Cardiology Office Note:    Date:  09/01/2021   ID:  Kimberly Raymond, DOB [birth year], MRN [id number]  PCP:  Kimberly Raymond., MD  Cardiologist:  Kimberly More, MD    Referring MD: Kimberly Raymond., MD    ASSESSMENT:    1. Hypertensive heart disease with heart failure ([place])   2. Chronic atrial fibrillation (HCC)   3. Pacemaker   4. Chronic obstructive pulmonary disease, unspecified COPD type ([place])   5. Cirrhosis of liver with ascites, unspecified hepatic cirrhosis type (HCC)    PLAN:    In order of problems listed above:  Is a very difficult case that she has multiple mechanisms for shortness of breath including hypertensive heart disease with heart failure in the setting of atrial fibrillation right ventricular pacemaker severe COPD as well as weakness and debilitation.  I suspect all of these play a role she clearly has lost body mass and I am going to increase her diuretic to twice daily for a week then alternating once and twice daily afterwards to see if an increase in her diuretic will result in a functional improvement.  She will return in 2 weeks to have recheck labs. Stable rate controlled she is permanently paced follow-up pacemaker function She has very significant COPD and I told her daughter that small changes in chronic lung disease and heart disease unfortunately can translate into large changes in functional ability of patients and again I think her shortness of breath is multifactorial. Increase her diuretic   Next appointment: I will see her back in 4 weeks   Medication Adjustments/Labs and Tests Ordered: Current medicines are reviewed at length with the patient today.  Concerns regarding medicines are outlined above.  Orders Placed This Encounter  Procedures   Basic metabolic panel   Pro b natriuretic peptide (BNP)   EKG 12-Lead    Meds ordered this encounter  Medications   furosemide (LASIX) 20 MG tablet    Sig: 20 mg twice daily for 1 week then take 1 daily  alternating with twice daily.    Dispense:  60 tablet    Refill:  12     Chief Complaint  Patient presents with   Follow-up   Congestive Heart Failure   Shortness of Breath     History of Present Illness:    Kimberly Raymond is a 87 y.o. female with a hx of chronic atrial fibrillation with AV node ablation for rate control and permanent pacemaker COPD sleep apnea and hypertensive heart disease with heart failure and intermediate ejection fraction 45 to 50%.    Compliance with diet, lifestyle and medications: Yes  She is seen in follow-up both for the ED visit and from the perspective of atrial fibrillation and heart failure She continues to lose weight is increasingly frail She also continues to have shortness of breath that was worse the day she went to the emergency room she was seen by pulmonary they asked her to increase her diuretic for a few days and her understanding she was told that it was not due to worsening of her chronic lung disease She has intermittent edema at home No orthopnea chest pain palpitation or syncope She is not anticoagulated  She was admitted [facility] from the ED with chest pain self for 11/2020 that was felt to be noncardiac.  Her rhythm was paced troponin high-sensitivity was normal CTA showed no abnormality of pulmonary embolism but did show hepatic cirrhosis with portal venous hypertension and  a small amount of ascitic fluid.  She was felt to have musculoskeletal chest wall pain.  Echocardiogram showed EF 65 to 70% improved with Entresto therapy.   She was seen [facility] atrium ED [facility] 07/17/2021 with shortness of breath nausea vomiting and abdominal pain.  High-sensitivity troponin was normal without delta.  White count normal 8900 hemoglobin 11.4 BNP level modestly elevated 276 sodium 136 potassium 4.6 creatinine 0.46 GFR greater than 90 cc.  She had CT scan chest abdomen and pelvis no acute process was noted cirrhosis was seen  small amount of pelvic fluid felt to be related to cirrhosis and she had aortic and coronary artery atherosclerosis and emphysema.  Although she had no edema or radiographic findings of heart failure she was given IV Lasix in the emergency room there is no discrete diagnosis for abdominal pain she was discharged from the hospital. Past Medical History:  Diagnosis Date   Acute bronchitis, complicated 09/11/2018   Acute exacerbation of COPD with asthma (HCC) 02/01/2016   Last Assessment & Plan:  Formatting of this note might be different from the original. Relevant Hx: Course: Daily Update: Today's Plan:she is going to have labs, and CXR as well as she is going to have oral abx called in and she is going to have BNP and pulmonary referral. She is not using any oxygen now as she sent it back due to the cost , she has not been using an inhaled steroid as she only go   Age-related osteoporosis without current pathological fracture 05/18/2011   Last Assessment & Plan:  Relevant Hx: Course: Daily Update: Today's Plan:she is following with Dr. Albertha Ghee for this and recieves prolia twice a year and is due for this in May  Electronically signed by: [name], NP 02/16/16 2101   Allergic rhinitis    Amblyopia, right eye 11/08/2015   Aortic atherosclerosis (HCC) 02/17/2018   Arthritis 05/18/2011   Branch retinal vein occlusion of left eye 11/08/2015   Cardiac pacemaker in situ 05/18/2011   Cerebral thrombosis with cerebral infarction (HCC) 05/18/2011   Chronic atrial fibrillation (HCC) 11/22/2015   Overview:  With His ablation and permanent pacemaker for rate control, CHADS2 vasc score= 7  Overview:  Overview:  With His ablation and permanent pacemaker for rate control, CHADS2 vasc score= 7  Last Assessment & Plan:  Relevant Hx: Course: Daily Update: Today's Plan:she has pacemaker and is no longer taking eliquis   Electronically signed by: [name], NP 02/16/16 2059   Chronic  bilateral back pain 08/14/2017   Chronic diastolic heart failure (HCC) 11/22/2015   Chronic edema 04/01/2017   Chronic obstructive pulmonary disease (HCC) 10/18/2016   Chronic pansinusitis 05/22/2018   Chronic respiratory failure with hypoxia (HCC) 03/06/2018   Chronic rhinitis 05/22/2018   Contracture of finger joint, left 10/30/2016   COPD (chronic obstructive pulmonary disease) (HCC)    Coronary artery calcification seen on CAT scan 04/01/2018   Seen on CT scan March 2019 with three-vessel calcification   Coronary artery disease involving native coronary artery of native heart with angina pectoris (HCC) 04/01/2018   Formatting of this note might be different from the original. Seen on CT scan March 2019 with three-vessel calcification   Costochondral chest pain 11/22/2015   Dizziness 05/22/2016   Last Assessment & Plan:  She is going to have meclizine sent in for her and she is aware of the need to have safety due to sleepy effect   Dupuytren's  contracture of left hand 10/30/2016   Epiretinal membrane (ERM) of left eye 11/08/2015   Essential hypertension 02/01/2016   Last Assessment & Plan:  Relevant Hx: Course: Daily Update: Today's Plan:this is fair for her right now and with her being sick it is not going to be ideal right now will follow  Electronically signed by: Mayer Camel, NP 02/16/16 2057   Essential tremor 05/18/2011   Extensor tendonitis of foot 12/25/2018   Gait abnormality 08/15/2017   High risk medication use 05/30/2017   Hip joint pain 12/29/2013   History of dizziness 05/22/2016   Last Assessment & Plan:  She is going to have meclizine sent in for her and she is aware of the need to have safety due to sleepy effect   Hypertensive heart disease with heart failure ([place]) 11/22/2015   Hypertensive retinopathy of both eyes 11/08/2015   Last Assessment & Plan:  Relevant Hx: Course: Daily Update: Today's Plan:she is following with specialist for this currently  Electronically signed by:  Mayer Camel, NP 02/16/16 2059   Iron deficiency 05/22/2018   Laryngopharyngeal reflux    Left epiretinal membrane 06/26/2016   Left foot pain 04/08/2014   Lumbar radiculopathy, right 06/01/2020   Mixed hyperlipidemia 02/01/2016   Last Assessment & Plan:  Relevant Hx: Course: Daily Update: Today's Plan:there is going to be fasting lipids done   Electronically signed by: Mayer Camel, NP 02/16/16 2058   Multiple falls 08/15/2017   Nocturnal oxygen desaturation    Non-rheumatic mitral regurgitation 01/08/2016   Overview:  Mild   Osteoporosis 05/18/2011   Other dietary vitamin B12 deficiency anemia 04/15/2018   Other spondylosis with radiculopathy, lumbar region 06/01/2020   Pacemaker 05/18/2011   Manufacturer of Device: Medtronic  Type of Device: Single Chamber Pacemaker       Posterior vitreous detachment of both eyes 06/26/2016   Presence of cardiac pacemaker 05/18/2011   Pressure injury of skin of dorsum of right foot 04/10/2018   Pseudophakia of both eyes 11/08/2015   PVD (posterior vitreous detachment), both eyes 11/08/2015   Retinal hemorrhage of both eyes 06/26/2016   Rib pain on right side 12/27/2017   S/P revision of total knee 08/10/2013   S/P total knee arthroplasty 12/29/2013   Shortness of breath 12/30/2017   Supplemental oxygen dependent 03/21/2018    Past Surgical History:  Procedure Laterality Date   ABDOMINAL HYSTERECTOMY     BACK SURGERY     FEMUR FRACTURE SURGERY Right    FINGER SURGERY Right    5 finger   PACEMAKER INSERTION     PR PART PALMAR FASCIEC,OPEN 1 DIGIT Left 10/30/2016      REPLACEMENT TOTAL KNEE Right    REVISION TOTAL KNEE ARTHROPLASTY Right    SKIN GRAFT     TONSILLECTOMY     TUBAL LIGATION      Current Medications: Current Meds  Medication Sig   acetaminophen (TYLENOL) 325 MG tablet Take 650 mg by mouth every 6 (six) hours as needed for mild pain.   albuterol (PROVENTIL HFA;VENTOLIN HFA) 108 (90 BASE) MCG/ACT inhaler Inhale 2 puffs  into the lungs every 4 (four) hours as needed for wheezing or shortness of breath.   Ascorbic Acid (VITAMIN C) 1000 MG tablet Take 1 tablet by mouth daily.   Biotin 1 MG CAPS Take 1 capsule by mouth daily.   Cholecalciferol (VITAMIN D3) 125 MCG (5000 UT) CAPS Take 1 capsule by mouth daily.    CRANBERRY EXTRACT PO Take  1 tablet by mouth daily.   cyanocobalamin (,VITAMIN B-12,) 1000 MCG/ML injection Inject 1,000 mcg into the muscle every 14 (fourteen) days.    denosumab (PROLIA) 60 MG/ML SOLN injection Inject 60 mg into the skin every 6 (six) months.   Dextran 70-Hypromellose (ARTIFICIAL TEARS PF OP) Place 1 drop into both eyes 3 (three) times daily.   Docusate Calcium (STOOL SOFTENER PO) Take 2 tablets by mouth daily.   ENTRESTO 49-51 MG Take 1 tablet by mouth 2 (two) times daily.   famotidine (PEPCID) 20 MG tablet Take 20 mg by mouth 2 (two) times daily.   fexofenadine (ALLEGRA) 180 MG tablet Take 180 mg by mouth daily.   furosemide (LASIX) 20 MG tablet 20 mg twice daily for 1 week then take 1 daily alternating with twice daily.   Garlic 99991111 MG CAPS Take 1,500 mg by mouth daily.   Multiple Vitamin (MULTIVITAMIN) tablet Take 1 tablet by mouth daily.   naproxen sodium (ALEVE) 220 MG tablet Take 220 mg by mouth daily as needed (pain).   Omega-3 Fatty Acids (OMEGA-3 FISH OIL PO) Take 1 capsule by mouth daily.   omeprazole (PRILOSEC) 40 MG capsule Take 1 capsule by mouth every morning.   ondansetron (ZOFRAN-ODT) 4 MG disintegrating tablet Take 4 mg by mouth every 8 (eight) hours as needed for nausea or vomiting.   polyethylene glycol powder (GLYCOLAX/MIRALAX) 17 GM/SCOOP powder Take 1 Container by mouth daily as needed for mild constipation.   primidone (MYSOLINE) 250 MG tablet Take 250 mg by mouth in the morning and at bedtime.   Probiotic Product (PROBIOTIC DAILY PO) Take 1 tablet by mouth daily.   Red Yeast Rice Extract 600 MG CAPS Take 1 capsule by mouth daily.   traMADol (ULTRAM) 50 MG tablet  Take 50 mg by mouth every 6 (six) hours as needed for moderate pain.    TURMERIC PO Take 1 tablet by mouth 2 (two) times daily.   umeclidinium-vilanterol (ANORO ELLIPTA) 62.5-25 MCG/INH AEPB Inhale 1 puff into the lungs daily.   vitamin E 1000 UNIT capsule Take 1 capsule by mouth daily.   [DISCONTINUED] furosemide (LASIX) 20 MG tablet Take 20 mg by mouth daily.     Allergies:   Lidocaine hcl, Procaine, Propranolol, Codeine, Epinephrine, Gabapentin, Levofloxacin, Other, Latex, and Sulfamethoxazole   Social History   Socioeconomic History   Marital status: Divorced    Spouse name: Not on file   Number of children: Not on file   Years of education: Not on file   Highest education level: Not on file  Occupational History   Not on file  Tobacco Use   Smoking status: Former    Packs/day: 1.00    Years: 35.00    Pack years: 35.00    Types: Cigarettes    Quit date: 10/22/1985    Years since quitting: 35.8   Smokeless tobacco: Never  Vaping Use   Vaping Use: Never used  Substance and Sexual Activity   Alcohol use: No   Drug use: No   Sexual activity: Not on file  Other Topics Concern   Not on file  Social History Narrative   Not on file   Social Determinants of Health   Financial Resource Strain: Not on file  Food Insecurity: Not on file  Transportation Needs: Not on file  Physical Activity: Not on file  Stress: Not on file  Social Connections: Not on file     Family History: The patient's family history includes ALS in her  mother; Stroke in her sister; Throat cancer in her sister; Ulcers in her father. ROS:   Please see the history of present illness.    All other systems reviewed and are negative.  EKGs/Labs/Other Studies Reviewed:    The following studies were reviewed today:   Physical Exam:    VS:  BP (!) 160/88   Pulse 82   Ht 5\' 6"  (1.676 m)   Wt 129 lb 3.2 oz (58.6 kg)   SpO2 92%   BMI 20.85 kg/m     Wt Readings from Last 3 Encounters:  09/01/21 129  lb 3.2 oz (58.6 kg)  03/09/21 132 lb 6.4 oz (60.1 kg)  12/02/20 131 lb (59.4 kg)     GEN: She looks quite frail tremulous COPD appearance well nourished, well developed in no acute distress HEENT: Normal NECK: No JVD; No carotid bruits LYMPHATICS: No lymphadenopathy CARDIAC: She is ventricularly paced RRR, no murmurs, rubs, gallops RESPIRATORY: Hyperinflated diffusely diminished breath sounds prolonged expiration without rales, wheezing or rhonchi  ABDOMEN: Soft, non-tender, non-distended MUSCULOSKELETAL:  No edema; No deformity  SKIN: Warm and dry NEUROLOGIC:  Alert and oriented x 3 PSYCHIATRIC:  Normal affect    Signed, Kimberly More, MD  09/01/2021 11:59 AM    [place]

## 2021-09-04 ENCOUNTER — Ambulatory Visit (INDEPENDENT_AMBULATORY_CARE_PROVIDER_SITE_OTHER): Payer: Medicare Other | Admitting: Cardiology

## 2021-09-04 ENCOUNTER — Other Ambulatory Visit: Payer: Self-pay

## 2021-09-04 ENCOUNTER — Encounter: Payer: Self-pay | Admitting: Cardiology

## 2021-09-04 VITALS — BP 122/70 | HR 82 | Resp 18 | Ht 65.0 in | Wt 130.0 lb

## 2021-09-04 DIAGNOSIS — I4821 Permanent atrial fibrillation: Secondary | ICD-10-CM | POA: Diagnosis not present

## 2021-09-04 NOTE — Patient Instructions (Signed)
Medication Instructions:  Your physician recommends that you continue on your current medications as directed. Please refer to the Current Medication list given to you today.  *If you need a refill on your cardiac medications before your next appointment, please call your pharmacy*   Lab Work: None ordered   Testing/Procedures: None ordered   Follow-Up: At [facility], you and your health needs are our priority.  As part of our continuing mission to provide you with exceptional heart care, we have created designated Provider Care Teams.  These Care Teams include your primary Cardiologist (physician) and Advanced Practice Providers (APPs -  Physician Assistants and Nurse Practitioners) who all work together to provide you with the care you need, when you need it.   Remote monitoring is used to monitor your Pacemaker or ICD from home. This monitoring reduces the number of office visits required to check your device to one time per year. It allows [place] to keep an eye on the functioning of your device to ensure it is working properly. You are scheduled for a device check from home on 09/18/2021. You may send your transmission at any time that day. If you have a wireless device, the transmission will be sent automatically. After your physician reviews your transmission, you will receive a postcard with your next transmission date.  Your next appointment:   1 year(s)  The format for your next appointment:   In Person  Provider:   Loman Brooklyn, MD   Thank you for choosing [facility]!!   Dory Horn, RN [phone]

## 2021-09-04 NOTE — Progress Notes (Signed)
Electrophysiology Office Note   Date:  09/04/2021   ID:  Kimberly Raymond, DOB 1934/04/22, MRN [id number]  PCP:  Gordan Payment., MD  Cardiologist:  Dulce Sellar Primary Electrophysiologist:  Valmai Vandenberghe [name], MD    Chief Complaint: pacemaker   History of Present Illness: Kimberly Raymond is a 87 y.o. female who is being seen today for the evaluation of pacemaker at the request of Gordan Payment., MD. Presenting today for electrophysiology evaluation.  She has a history significant for COPD, chronic systolic heart failure, chronic permanent atrial fibrillation.  She is status post AV node ablation.  She has a Medtronic dual-chamber pacemaker that was implanted in 2005.  She also has a history of hypertension.  Today, denies symptoms of palpitations, chest pain, shortness of breath, orthopnea, PND, lower extremity edema, claudication, dizziness, presyncope, syncope, bleeding, or neurologic sequela. The patient is tolerating medications without difficulties.  Since being seen she has done well.  She has had no chest pain or shortness of breath.  She able to do all of her daily activities without restriction.  Her pacemaker is that ERI in 11 months.   Past Medical History:  Diagnosis Date   Acute bronchitis, complicated 09/11/2018   Acute exacerbation of COPD with asthma (HCC) 02/01/2016   Last Assessment & Plan:  Formatting of this note might be different from the original. Relevant Hx: Course: Daily Update: Today's Plan:she is going to have labs, and CXR as well as she is going to have oral abx called in and she is going to have BNP and pulmonary referral. She is not using any oxygen now as she sent it back due to the cost , she has not been using an inhaled steroid as she only go   Age-related osteoporosis without current pathological fracture 05/18/2011   Last Assessment & Plan:  Relevant Hx: Course: Daily Update: Today's Plan:she is following with Dr. Albertha Ghee for this and recieves prolia  twice a year and is due for this in May  Electronically signed by: [name], NP 02/16/16 2101   Allergic rhinitis    Amblyopia, right eye 11/08/2015   Aortic atherosclerosis (HCC) 02/17/2018   Arthritis 05/18/2011   Branch retinal vein occlusion of left eye 11/08/2015   Cardiac pacemaker in situ 05/18/2011   Cerebral thrombosis with cerebral infarction (HCC) 05/18/2011   Chronic atrial fibrillation (HCC) 11/22/2015   Overview:  With His ablation and permanent pacemaker for rate control, CHADS2 vasc score= 7  Overview:  Overview:  With His ablation and permanent pacemaker for rate control, CHADS2 vasc score= 7  Last Assessment & Plan:  Relevant Hx: Course: Daily Update: Today's Plan:she has pacemaker and is no longer taking eliquis   Electronically signed by: [name], NP 02/16/16 2059   Chronic bilateral back pain 08/14/2017   Chronic diastolic heart failure (HCC) 11/22/2015   Chronic edema 04/01/2017   Chronic obstructive pulmonary disease (HCC) 10/18/2016   Chronic pansinusitis 05/22/2018   Chronic respiratory failure with hypoxia (HCC) 03/06/2018   Chronic rhinitis 05/22/2018   Contracture of finger joint, left 10/30/2016   COPD (chronic obstructive pulmonary disease) (HCC)    Coronary artery calcification seen on CAT scan 04/01/2018   Seen on CT scan March 2019 with three-vessel calcification   Coronary artery disease involving native coronary artery of native heart with angina pectoris (HCC) 04/01/2018   Formatting of this note might be different from the original. Seen on CT scan March 2019 with three-vessel  calcification   Costochondral chest pain 11/22/2015   Dizziness 05/22/2016   Last Assessment & Plan:  She is going to have meclizine sent in for her and she is aware of the need to have safety due to sleepy effect   Dupuytren's contracture of left hand 10/30/2016   Epiretinal membrane (ERM) of left eye 11/08/2015   Essential hypertension 02/01/2016   Last Assessment  & Plan:  Relevant Hx: Course: Daily Update: Today's Plan:this is fair for her right now and with her being sick it is not going to be ideal right now [place] [facility] follow  Electronically signed by: Mayer Camel, NP 02/16/16 2057   Essential tremor 05/18/2011   Extensor tendonitis of foot 12/25/2018   Gait abnormality 08/15/2017   High risk medication use 05/30/2017   Hip joint pain 12/29/2013   History of dizziness 05/22/2016   Last Assessment & Plan:  She is going to have meclizine sent in for her and she is aware of the need to have safety due to sleepy effect   Hypertensive heart disease with heart failure ([place]) 11/22/2015   Hypertensive retinopathy of both eyes 11/08/2015   Last Assessment & Plan:  Relevant Hx: Course: Daily Update: Today's Plan:she is following with specialist for this currently  Electronically signed by: Mayer Camel, NP 02/16/16 2059   Iron deficiency 05/22/2018   Laryngopharyngeal reflux    Left epiretinal membrane 06/26/2016   Left foot pain 04/08/2014   Lumbar radiculopathy, right 06/01/2020   Mixed hyperlipidemia 02/01/2016   Last Assessment & Plan:  Relevant Hx: Course: Daily Update: Today's Plan:there is going to be fasting lipids done   Electronically signed by: Mayer Camel, NP 02/16/16 2058   Multiple falls 08/15/2017   Nocturnal oxygen desaturation    Non-rheumatic mitral regurgitation 01/08/2016   Overview:  Mild   Osteoporosis 05/18/2011   Other dietary vitamin B12 deficiency anemia 04/15/2018   Other spondylosis with radiculopathy, lumbar region 06/01/2020   Pacemaker 05/18/2011   Manufacturer of Device: Medtronic  Type of Device: Single Chamber Pacemaker       Posterior vitreous detachment of both eyes 06/26/2016   Presence of cardiac pacemaker 05/18/2011   Pressure injury of skin of dorsum of right foot 04/10/2018   Pseudophakia of both eyes 11/08/2015   PVD (posterior vitreous detachment), both eyes 11/08/2015   Retinal hemorrhage of both  eyes 06/26/2016   Rib pain on right side 12/27/2017   S/P revision of total knee 08/10/2013   S/P total knee arthroplasty 12/29/2013   Shortness of breath 12/30/2017   Supplemental oxygen dependent 03/21/2018   Past Surgical History:  Procedure Laterality Date   ABDOMINAL HYSTERECTOMY     BACK SURGERY     FEMUR FRACTURE SURGERY Right    FINGER SURGERY Right    5 finger   PACEMAKER INSERTION     PR PART PALMAR FASCIEC,OPEN 1 DIGIT Left 10/30/2016      REPLACEMENT TOTAL KNEE Right    REVISION TOTAL KNEE ARTHROPLASTY Right    SKIN GRAFT     TONSILLECTOMY     TUBAL LIGATION       Current Outpatient Medications  Medication Sig Dispense Refill   acetaminophen (TYLENOL) 325 MG tablet Take 650 mg by mouth every 6 (six) hours as needed for mild pain.     albuterol (PROVENTIL HFA;VENTOLIN HFA) 108 (90 BASE) MCG/ACT inhaler Inhale 2 puffs into the lungs every 4 (four) hours as needed for wheezing or shortness of breath.  Ascorbic Acid (VITAMIN C) 1000 MG tablet Take 1 tablet by mouth daily.     Biotin 1 MG CAPS Take 1 capsule by mouth daily.     Cholecalciferol (VITAMIN D3) 125 MCG (5000 UT) CAPS Take 1 capsule by mouth daily.      CRANBERRY EXTRACT PO Take 1 tablet by mouth daily.     cyanocobalamin (,VITAMIN B-12,) 1000 MCG/ML injection Inject 1,000 mcg into the muscle every 14 (fourteen) days.      denosumab (PROLIA) 60 MG/ML SOLN injection Inject 60 mg into the skin every 6 (six) months.     Dextran 70-Hypromellose (ARTIFICIAL TEARS PF OP) Place 1 drop into both eyes 3 (three) times daily.     Docusate Calcium (STOOL SOFTENER PO) Take 2 tablets by mouth daily.     ENTRESTO 49-51 MG Take 1 tablet by mouth 2 (two) times daily. 180 tablet 2   famotidine (PEPCID) 20 MG tablet Take 20 mg by mouth 2 (two) times daily.     fexofenadine (ALLEGRA) 180 MG tablet Take 180 mg by mouth daily.     furosemide (LASIX) 20 MG tablet 20 mg twice daily for 1 week then take 1 daily alternating with twice daily.  60 tablet 12   Garlic 99991111 MG CAPS Take 1,500 mg by mouth daily.     Multiple Vitamin (MULTIVITAMIN) tablet Take 1 tablet by mouth daily.     naproxen sodium (ALEVE) 220 MG tablet Take 220 mg by mouth daily as needed (pain).     Omega-3 Fatty Acids (OMEGA-3 FISH OIL PO) Take 1 capsule by mouth daily.     omeprazole (PRILOSEC) 40 MG capsule Take 1 capsule by mouth every morning.     ondansetron (ZOFRAN-ODT) 4 MG disintegrating tablet Take 4 mg by mouth every 8 (eight) hours as needed for nausea or vomiting.     polyethylene glycol powder (GLYCOLAX/MIRALAX) 17 GM/SCOOP powder Take 1 Container by mouth daily as needed for mild constipation.     primidone (MYSOLINE) 250 MG tablet Take 250 mg by mouth in the morning and at bedtime.     Probiotic Product (PROBIOTIC DAILY PO) Take 1 tablet by mouth daily.     Red Yeast Rice Extract 600 MG CAPS Take 1 capsule by mouth daily.     traMADol (ULTRAM) 50 MG tablet Take 50 mg by mouth every 6 (six) hours as needed for moderate pain.      TURMERIC PO Take 1 tablet by mouth 2 (two) times daily.     umeclidinium-vilanterol (ANORO ELLIPTA) 62.5-25 MCG/INH AEPB Inhale 1 puff into the lungs daily.     vitamin E 1000 UNIT capsule Take 1 capsule by mouth daily.     No current facility-administered medications for this visit.    Allergies:   Lidocaine hcl, Procaine, Propranolol, Codeine, Epinephrine, Gabapentin, Levofloxacin, Other, Latex, and Sulfamethoxazole   Social History:  The patient  reports that she quit smoking about 35 years ago. Her smoking use included cigarettes. She has a 35.00 pack-year smoking history. She has never used smokeless tobacco. She reports that she does not drink alcohol and does not use drugs.   Family History:  The patient's family history includes ALS in her mother; Stroke in her sister; Throat cancer in her sister; Ulcers in her father.   ROS:  Please see the history of present illness.   Otherwise, review of systems is positive  for none.   All other systems are reviewed and negative.   PHYSICAL EXAM:  VS:  BP 122/70   Pulse 82   Resp 18   Ht 5\' 5"  (1.651 m)   Wt 130 lb (59 kg)   SpO2 96%   BMI 21.63 kg/m  , BMI Body mass index is 21.63 kg/m. GEN: Well nourished, well developed, in no acute distress  HEENT: normal  Neck: no JVD, carotid bruits, or masses Cardiac: RRR; no murmurs, rubs, or gallops,no edema  Respiratory:  clear to auscultation bilaterally, normal work of breathing GI: soft, nontender, nondistended, + BS MS: no deformity or atrophy  Skin: warm and dry, device site well healed Neuro:  Strength and sensation are intact Psych: euthymic mood, full affect  EKG:  EKG is not ordered today. Personal review of the ekg ordered 09/01/21 shows AF, V paced  Personal review of the device interrogation today. Results in [place]: No results found for requested labs within last 8760 hours.    Lipid Panel  No results found for: CHOL, TRIG, HDL, CHOLHDL, VLDL, LDLCALC, LDLDIRECT   Wt Readings from Last 3 Encounters:  09/04/21 130 lb (59 kg)  09/01/21 129 lb 3.2 oz (58.6 kg)  03/09/21 132 lb 6.4 oz (60.1 kg)      Other studies Reviewed: Additional studies/ records that were reviewed today include: TTE 02/04/20  Review of the above records today demonstrates:   1. Left ventricular ejection fraction, by estimation, is 45 to 50%. The  left ventricle has mildly decreased function. The left ventricle has no  regional wall motion abnormalities. There is mild concentric left  ventricular hypertrophy. Left ventricular  diastolic parameters are consistent with Grade I diastolic dysfunction  (impaired relaxation).   2. Right ventricular systolic function is mildly reduced. The right  ventricular size is moderately enlarged. Device lead seen in the right  ventricle.   3. Left atrial size was severely dilated.   4. Right atrial size was severely dilated. Device lead seen in the right   atrium.   5. The mitral valve is normal in structure. Mild to moderate mitral valve  regurgitation. No evidence of mitral stenosis.   6. Tricuspid valve regurgitation is moderate.   7. The aortic valve is tricuspid. Aortic valve regurgitation is not  visualized. No aortic stenosis is present.   8. Moderate Pulmonary Hypertension. Pulmonary artery systolic pressure  123456 mmHG.   9. Right to left shunt noted by color dopper (clip 60 & 61).  10. The inferior vena cava is dilated in size with <50% respiratory  variability, suggesting right atrial pressure of 15 mmHg.    ASSESSMENT AND PLAN:  1.  Permanent atrial fibrillation: Status post AV node ablation.  Currently on Eliquis.  CHA2DS2-VASc of 4.  Status post Medtronic dual-chamber pacemaker implanted in 2005.  Her device is 11 months from ERI.  We Estreya Clay start checking it monthly in February.  Aside from that, she is feeling well without complaint.  2.  Pacemaker: Implanted in 2005.  Patient is device dependent due to her AV node ablation.  Device functioning appropriately.  We Sam Overbeck continue with current management.  3.  Hypertension: Currently well controlled  Current medicines are reviewed at length with the patient today.   The patient does not have concerns regarding her medicines.  The following changes were made today: None  Labs/ tests ordered today include:  No orders of the defined types were placed in this encounter.    Disposition:   FU with Sheelah Ritacco 1 year  Signed, Finneus Kaneshiro  Meredith Leeds, MD  09/04/2021 11:30 AM     [facility] [address] [place] [address] [phone] (office) [phone] (fax)

## 2021-09-18 ENCOUNTER — Ambulatory Visit (INDEPENDENT_AMBULATORY_CARE_PROVIDER_SITE_OTHER): Payer: Medicare Other

## 2021-09-18 DIAGNOSIS — I482 Chronic atrial fibrillation, unspecified: Secondary | ICD-10-CM

## 2021-09-19 ENCOUNTER — Ambulatory Visit: Payer: Medicare Other | Admitting: Sports Medicine

## 2021-09-19 LAB — CUP PACEART REMOTE DEVICE CHECK
Battery Impedance: 4424 Ohm
Battery Remaining Longevity: 11 mo
Battery Voltage: 2.69 V
Brady Statistic RV Percent Paced: 100 %
Date Time Interrogation Session: 20221128142757
Implantable Lead Implant Date: 20050802
Implantable Lead Location: 753860
Implantable Lead Model: 4076
Implantable Pulse Generator Implant Date: 20140717
Lead Channel Impedance Value: 0 Ohm
Lead Channel Impedance Value: 575 Ohm
Lead Channel Pacing Threshold Amplitude: 0.5 V
Lead Channel Pacing Threshold Pulse Width: 0.4 ms
Lead Channel Setting Pacing Amplitude: 2.5 V
Lead Channel Setting Pacing Pulse Width: 0.4 ms
Lead Channel Setting Sensing Sensitivity: 4 mV

## 2021-09-27 NOTE — Progress Notes (Signed)
Remote pacemaker transmission.   

## 2021-10-03 ENCOUNTER — Ambulatory Visit (INDEPENDENT_AMBULATORY_CARE_PROVIDER_SITE_OTHER): Payer: Medicare Other | Admitting: Sports Medicine

## 2021-10-03 ENCOUNTER — Encounter: Payer: Self-pay | Admitting: Sports Medicine

## 2021-10-03 DIAGNOSIS — S9031XA Contusion of right foot, initial encounter: Secondary | ICD-10-CM | POA: Diagnosis not present

## 2021-10-03 DIAGNOSIS — M79671 Pain in right foot: Secondary | ICD-10-CM | POA: Diagnosis not present

## 2021-10-03 DIAGNOSIS — I739 Peripheral vascular disease, unspecified: Secondary | ICD-10-CM | POA: Diagnosis not present

## 2021-10-03 DIAGNOSIS — M792 Neuralgia and neuritis, unspecified: Secondary | ICD-10-CM | POA: Diagnosis not present

## 2021-10-03 NOTE — Progress Notes (Addendum)
Subjective: Kimberly Raymond is a 87 y.o. female patient who presents to office for follow-up evaluation of right foot> left foot pain.  Patient reports that things are about the same has not had an opportunity to try the nerve vitamin and has not been wearing the compression garments as I have dispensed last visit states that the heel lifts help denies any other pedal complaints.  Patient is assisted by daughter this visit.  Patient Active Problem List   Diagnosis Date Noted   Bilateral impacted cerumen 08/24/2021   GERD without esophagitis 07/20/2021   Personal history of COVID-19 06/08/2021   Lumbar facet joint syndrome 12/16/2020   Iliotibial band syndrome affecting lower leg, right 12/16/2020   Chronic pain of left knee 11/22/2020   Recurrent UTI 10/19/2020   BPV (benign positional vertigo), bilateral 10/04/2020   Malaise and fatigue 10/04/2020   Closed trochanteric fracture of left femur with routine healing 08/31/2020   Dermatochalasis of both upper eyelids 06/23/2020   Nocturnal oxygen desaturation    Laryngopharyngeal reflux    COPD (chronic obstructive pulmonary disease) (HCC)    Allergic rhinitis    Lumbar radiculopathy, right 06/01/2020   Other spondylosis with radiculopathy, lumbar region 06/01/2020   Extensor tendonitis of foot 12/25/2018   Acute bronchitis, complicated 09/11/2018   Chronic rhinitis 05/22/2018   Iron deficiency 05/22/2018   Chronic pansinusitis 05/22/2018   Other dietary vitamin B12 deficiency anemia 04/15/2018   Pressure injury of skin of dorsum of right foot 04/10/2018   Coronary artery calcification seen on CAT scan 04/01/2018   Coronary artery disease involving native coronary artery of native heart with angina pectoris (HCC) 04/01/2018   Supplemental oxygen dependent 03/21/2018   Chronic respiratory failure with hypoxia (HCC) 03/06/2018   Aortic atherosclerosis (HCC) 02/17/2018   Shortness of breath 12/30/2017   Rib pain on right side 12/27/2017    Gait abnormality 08/15/2017   Multiple falls 08/15/2017   Chronic bilateral back pain 08/14/2017   High risk medication use 05/30/2017   Chronic edema 04/01/2017   Contracture of finger joint, left 10/30/2016   Dupuytren's contracture of left hand 10/30/2016   Chronic obstructive pulmonary disease (HCC) 10/18/2016   Left epiretinal membrane 06/26/2016   Posterior vitreous detachment of both eyes 06/26/2016   Retinal hemorrhage of both eyes 06/26/2016   Dizziness 05/22/2016   History of dizziness 05/22/2016   Mixed hyperlipidemia 02/01/2016   Essential hypertension 02/01/2016   Acute exacerbation of COPD with asthma (HCC) 02/01/2016   Non-rheumatic mitral regurgitation 01/08/2016   Chronic atrial fibrillation (HCC) 11/22/2015   Chronic diastolic heart failure (HCC) 11/22/2015   Costochondral chest pain 11/22/2015   Hypertensive heart disease with heart failure (HCC) 11/22/2015   Amblyopia, right eye 11/08/2015   Branch retinal vein occlusion of left eye 11/08/2015   Epiretinal membrane (ERM) of left eye 11/08/2015   Hypertensive retinopathy of both eyes 11/08/2015   Pseudophakia of both eyes 11/08/2015   PVD (posterior vitreous detachment), both eyes 11/08/2015   Left foot pain 04/08/2014   S/P total knee arthroplasty 12/29/2013   Hip joint pain 12/29/2013   S/P revision of total knee 08/10/2013   Age-related osteoporosis without current pathological fracture 05/18/2011   Arthritis 05/18/2011   Cardiac pacemaker in situ 05/18/2011   Cerebral thrombosis with cerebral infarction [facility]) 05/18/2011   Essential tremor 05/18/2011   Osteoporosis 05/18/2011   Pacemaker 05/18/2011   Presence of cardiac pacemaker 05/18/2011    Current Outpatient Medications on File Prior to Visit  Medication Sig Dispense Refill   acetaminophen (TYLENOL) 325 MG tablet Take 650 mg by mouth every 6 (six) hours as needed for mild pain.     albuterol (PROVENTIL HFA;VENTOLIN HFA) 108 (90 BASE) MCG/ACT  inhaler Inhale 2 puffs into the lungs every 4 (four) hours as needed for wheezing or shortness of breath.     Ascorbic Acid (VITAMIN C) 1000 MG tablet Take 1 tablet by mouth daily.     Biotin 1 MG CAPS Take 1 capsule by mouth daily.     Cholecalciferol (VITAMIN D3) 125 MCG (5000 UT) CAPS Take 1 capsule by mouth daily.      CRANBERRY EXTRACT PO Take 1 tablet by mouth daily.     cyanocobalamin (,VITAMIN B-12,) 1000 MCG/ML injection Inject 1,000 mcg into the muscle every 14 (fourteen) days.      denosumab (PROLIA) 60 MG/ML SOLN injection Inject 60 mg into the skin every 6 (six) months.     Dextran 70-Hypromellose (ARTIFICIAL TEARS PF OP) Place 1 drop into both eyes 3 (three) times daily.     Docusate Calcium (STOOL SOFTENER PO) Take 2 tablets by mouth daily.     ENTRESTO 49-51 MG Take 1 tablet by mouth 2 (two) times daily. 180 tablet 2   famotidine (PEPCID) 20 MG tablet Take 20 mg by mouth 2 (two) times daily.     fexofenadine (ALLEGRA) 180 MG tablet Take 180 mg by mouth daily.     furosemide (LASIX) 20 MG tablet 20 mg twice daily for 1 week then take 1 daily alternating with twice daily. 60 tablet 12   Garlic 99991111 MG CAPS Take 1,500 mg by mouth daily.     Multiple Vitamin (MULTIVITAMIN) tablet Take 1 tablet by mouth daily.     naproxen sodium (ALEVE) 220 MG tablet Take 220 mg by mouth daily as needed (pain).     Omega-3 Fatty Acids (OMEGA-3 FISH OIL PO) Take 1 capsule by mouth daily.     omeprazole (PRILOSEC) 40 MG capsule Take 1 capsule by mouth every morning.     ondansetron (ZOFRAN-ODT) 4 MG disintegrating tablet Take 4 mg by mouth every 8 (eight) hours as needed for nausea or vomiting.     polyethylene glycol powder (GLYCOLAX/MIRALAX) 17 GM/SCOOP powder Take 1 Container by mouth daily as needed for mild constipation.     primidone (MYSOLINE) 250 MG tablet Take 250 mg by mouth in the morning and at bedtime.     Probiotic Product (PROBIOTIC DAILY PO) Take 1 tablet by mouth daily.     Red Yeast  Rice Extract 600 MG CAPS Take 1 capsule by mouth daily.     traMADol (ULTRAM) 50 MG tablet Take 50 mg by mouth every 6 (six) hours as needed for moderate pain.      TURMERIC PO Take 1 tablet by mouth 2 (two) times daily.     umeclidinium-vilanterol (ANORO ELLIPTA) 62.5-25 MCG/INH AEPB Inhale 1 puff into the lungs daily.     vitamin E 1000 UNIT capsule Take 1 capsule by mouth daily.     No current facility-administered medications on file prior to visit.    Allergies  Allergen Reactions   Lidocaine Hcl Shortness Of Breath   Procaine Shortness Of Breath    "in a dentist office" pt reports   Propranolol Shortness Of Breath   Codeine     Hallucinations   Epinephrine     Other reaction(s): Increased Heart Rate (intolerance)   Gabapentin Other (See Comments)  drowsiness   Levofloxacin     Other reaction(s): Malaise (intolerance)   Other Other (See Comments)    drowsiness   Latex Rash   Sulfamethoxazole Rash    Blisters in mouth    Objective:  General: Alert and oriented x3 in no acute distress  Dermatology: No open lesions bilateral lower extremities, no webspace macerations, significant ecchymosis to the dorsal right foot, hematoma noted to the anterior shin on the right self-inflicted, all nails x 10 are well manicured.  Vascular: Dorsalis Pedis and Posterior Tibial pedal nonpalpable with capillary fill time less than 5 seconds temperature gradient decreased purple discoloration and severe varicosities bilateral consistent with history of PVD  Neurology: Gross sensation intact via light touch bilateral, subjective diminished sensation on the right greater than left feels like a bunched up sock underneath her foot which has been unchanged from prior previous history of this since 2011 as noted previous  Musculoskeletal: Mild tenderness with palpation dorsal midfoot on the right greater than left, hammertoe deformity on right greater than left fat pad atrophy and prominent  metatarsal heads, with diffuse tenderness to palpation bilateral lower extremities.  Assessment and Plan: Problem List Items Addressed This Visit   None Visit Diagnoses     Contusion of right foot, initial encounter    -  Primary   PVD (peripheral vascular disease) ([place])       Neuritis       Right foot pain             -Complete examination performed -Discussed continued care for contusions that is slowly resolving on the right foot with less bruising eded for pain recommend stent placement age and thin skin to use no more than twice daily -Re-Discussed treatment options for neuritis -Advised patient to continue with vitamin B and may add on alpha lipoid acid or switch to using Nervive nerve relief supplement that she can get over-the-counter even though she has not tried it yet -Advised patient to try topical Voltaren for pain and continue with Tylenol as needed -Dispensed Surgigrip compression sleeve to support varicose veins and to help with any swelling and bruising advised patient if she likes please may purchase permanent compression garments from elastic therapy -May also try orthotics from [facility] [employer] store since previous heel lifts arise dispensed work well -Patient to return to office as needed or sooner if problems or issues arise Landis Martins, DPM

## 2021-10-03 NOTE — Patient Instructions (Signed)
Shoe List For tennis shoes recommend:  [employer] Asics New balance Saucony HOKA Can be purchased at [employer] sports or [employer] arch fit Can be purchased at any major retailers  Vionic  SAS Can be purchased at [employer] or [employer]   For work shoes recommend: [employer] Can be purchased at a variety of places or [employer]   For casual shoes recommend: Oofos Can be purchased at [employer] or [employer]  Can be purchased at [employer] or [employer]  For [employer] recommend: Power Steps Can be purchased in office/[facility] [employer] Can be purchased at [employer] or [employer] Can be purchased at [employer] For [employer] recommend: Therapist, sports at [employer] and [facility]    YOU MAY GET COMPRESSION STOCKINGS FROM [employer] MAX 12-54mmHG  YOU MAY LOOK INTO INSOLES FROM [employer] STORE  RECOMMEND TO TAKE THE Nervive Nerve vitamin

## 2021-10-13 ENCOUNTER — Ambulatory Visit: Payer: Medicare Other | Admitting: Cardiology

## 2021-11-28 ENCOUNTER — Other Ambulatory Visit: Payer: Self-pay | Admitting: Cardiology

## 2021-12-18 ENCOUNTER — Ambulatory Visit (INDEPENDENT_AMBULATORY_CARE_PROVIDER_SITE_OTHER): Payer: Medicare Other

## 2021-12-18 DIAGNOSIS — I482 Chronic atrial fibrillation, unspecified: Secondary | ICD-10-CM | POA: Diagnosis not present

## 2021-12-19 LAB — CUP PACEART REMOTE DEVICE CHECK
Battery Impedance: 5064 Ohm
Battery Remaining Longevity: 8 mo
Battery Voltage: 2.68 V
Brady Statistic RV Percent Paced: 100 %
Date Time Interrogation Session: 20230227111925
Implantable Lead Implant Date: 20050802
Implantable Lead Location: 753860
Implantable Lead Model: 4076
Implantable Pulse Generator Implant Date: 20140717
Lead Channel Impedance Value: 0 Ohm
Lead Channel Impedance Value: 529 Ohm
Lead Channel Pacing Threshold Amplitude: 0.5 V
Lead Channel Pacing Threshold Pulse Width: 0.4 ms
Lead Channel Setting Pacing Amplitude: 2.5 V
Lead Channel Setting Pacing Pulse Width: 0.4 ms
Lead Channel Setting Sensing Sensitivity: 4 mV

## 2021-12-25 NOTE — Progress Notes (Signed)
Remote pacemaker transmission.   

## 2021-12-26 ENCOUNTER — Encounter: Payer: Self-pay | Admitting: Cardiology

## 2021-12-26 ENCOUNTER — Other Ambulatory Visit: Payer: Self-pay

## 2021-12-26 ENCOUNTER — Ambulatory Visit (INDEPENDENT_AMBULATORY_CARE_PROVIDER_SITE_OTHER): Payer: Medicare Other | Admitting: Cardiology

## 2021-12-26 VITALS — BP 122/60 | HR 75 | Ht 65.0 in | Wt 127.0 lb

## 2021-12-26 DIAGNOSIS — R54 Age-related physical debility: Secondary | ICD-10-CM

## 2021-12-26 DIAGNOSIS — I110 Hypertensive heart disease with heart failure: Secondary | ICD-10-CM | POA: Diagnosis not present

## 2021-12-26 DIAGNOSIS — I482 Chronic atrial fibrillation, unspecified: Secondary | ICD-10-CM | POA: Diagnosis not present

## 2021-12-26 DIAGNOSIS — Z950 Presence of cardiac pacemaker: Secondary | ICD-10-CM

## 2021-12-26 MED ORDER — ENTRESTO 97-103 MG PO TABS
1.0000 | ORAL_TABLET | Freq: Two times a day (BID) | ORAL | 5 refills | Status: DC
Start: 2021-12-26 — End: 2022-06-26

## 2021-12-26 NOTE — Patient Instructions (Signed)
Medication Instructions:  ?Your physician has recommended you make the following change in your medication: Increase Entresto to 97/103 mg two times daily ? ?*If you need a refill on your cardiac medications before your next appointment, please call your pharmacy* ? ? ?Lab Work: ?Your physician recommends that you return for lab work in: Today for BMP and ProBnp ? ?If you have labs (blood work) drawn today and your tests are completely normal, you will receive your results only by: ?MyChart Message (if you have MyChart) OR ?A paper copy in the mail ?If you have any lab test that is abnormal or we need to change your treatment, we will call you to review the results. ? ? ?Testing/Procedures: ?NONE ? ? ?Follow-Up: ?At [facility], you and your health needs are our priority.  As part of our continuing mission to provide you with exceptional heart care, we have created designated Provider Care Teams.  These Care Teams include your primary Cardiologist (physician) and Advanced Practice Providers (APPs -  Physician Assistants and Nurse Practitioners) who all work together to provide you with the care you need, when you need it. ? ?We recommend signing up for the patient portal called "MyChart".  Sign up information is provided on this After Visit Summary.  MyChart is used to connect with patients for Virtual Visits (Telemedicine).  Patients are able to view lab/test results, encounter notes, upcoming appointments, etc.  Non-urgent messages can be sent to your provider as well.   ?To learn more about what you can do with MyChart, go to [email or web address].   ? ?Your next appointment:   ?3 month(s) ? ?The format for your next appointment:   ?In Person ? ?Provider:   ?Norman Herrlich, MD  ? ? ?Other Instructions ?  ?

## 2021-12-26 NOTE — Progress Notes (Signed)
Cardiology Office Note:    Date:  12/26/2021   ID:  Kimberly Raymond, DOB 12-29-1933, MRN [id number]  PCP:  Gordan Payment., MD  Cardiologist:  Norman Herrlich, MD    Referring MD: Gordan Payment., MD    ASSESSMENT:    1. Hypertensive heart disease with heart failure (HCC)   2. Chronic atrial fibrillation (HCC)   3. Pacemaker   4. Frail elderly    PLAN:    In order of problems listed above:  Unfortunately she continues to slowly deteriorate as consequence of her severe cardiac and pulmonary disease.  I think that she is well treated with her diuretic and has no fluid overload initially she responded to [facility] we will uptitrate with her next prescription and she will need to use oxygen more frequently.  I do not think a beta-blocker would be helpful here and with a mildly reduced ejection fraction other medications are not indicated.  If she continues to deteriorate [place] become palliative. Stable continue to monitor her device in our practice and again she is not anticoagulated at her choice I have asked her to be sure to get adequate calories and put a gun to her diet   Next appointment: 3 months   Medication Adjustments/Labs and Tests Ordered: Current medicines are reviewed at length with the patient today.  Concerns regarding medicines are outlined above.  No orders of the defined types were placed in this encounter.  No orders of the defined types were placed in this encounter.   Chief Complaint  Patient presents with   Follow-up   Congestive Heart Failure    History of Present Illness:    Kimberly Raymond is a 88 y.o. female with a hx of chronic atrial fibrillation with AV nodal ablation and pacemaker for rate control COPD sleep apnea and hypertensive heart disease with heart failure and mildly reduced ejection fraction 45 to 50% last seen 09/01/2021.  She was fluid overloaded short of breath.  Diuretic dosage was increased. Compliance with diet, lifestyle and medications:  Yes  Despite having no fluid overload she is not improving she is quite weak has to be assisted with ADLs and finds herself increasingly short of breath during the day using oxygen also as needed in addition at nighttime No orthopnea chest pain palpitation or syncope Home saturation around mid 90s. She is compliant with her diuretic  Her last pacemaker remote download 12/18/2021 she has normal battery and lead parameters. Past Medical History:  Diagnosis Date   Acute bronchitis, complicated 09/11/2018   Acute exacerbation of COPD with asthma (HCC) 02/01/2016   Last Assessment & Plan:  Formatting of this note might be different from the original. Relevant Hx: Course: Daily Update: Today's Plan:she is going to have labs, and CXR as well as she is going to have oral abx called in and she is going to have BNP and pulmonary referral. She is not using any oxygen now as she sent it back due to the cost , she has not been using an inhaled steroid as she only go   Age-related osteoporosis without current pathological fracture 05/18/2011   Last Assessment & Plan:  Relevant Hx: Course: Daily Update: Today's Plan:she is following with Dr. Albertha Ghee for this and recieves prolia twice a year and is due for this in May  Electronically signed by: [name], NP 02/16/16 2101   Allergic rhinitis    Amblyopia, right eye 11/08/2015   Aortic atherosclerosis (HCC) 02/17/2018  Arthritis 05/18/2011   Branch retinal vein occlusion of left eye 11/08/2015   Cardiac pacemaker in situ 05/18/2011   Cerebral thrombosis with cerebral infarction [facility]) 05/18/2011   Chronic atrial fibrillation (HCC) 11/22/2015   Overview:  With His ablation and permanent pacemaker for rate control, CHADS2 vasc score= 7  Overview:  Overview:  With His ablation and permanent pacemaker for rate control, CHADS2 vasc score= 7  Last Assessment & Plan:  Relevant Hx: Course: Daily Update: Today's Plan:she has pacemaker and is no longer  taking eliquis   Electronically signed by: [name], NP 02/16/16 2059   Chronic bilateral back pain 08/14/2017   Chronic diastolic heart failure (HCC) 11/22/2015   Chronic edema 04/01/2017   Chronic obstructive pulmonary disease (HCC) 10/18/2016   Chronic pansinusitis 05/22/2018   Chronic respiratory failure with hypoxia (HCC) 03/06/2018   Chronic rhinitis 05/22/2018   Contracture of finger joint, left 10/30/2016   COPD (chronic obstructive pulmonary disease) (HCC)    Coronary artery calcification seen on CAT scan 04/01/2018   Seen on CT scan March 2019 with three-vessel calcification   Coronary artery disease involving native coronary artery of native heart with angina pectoris (HCC) 04/01/2018   Formatting of this note might be different from the original. Seen on CT scan March 2019 with three-vessel calcification   Costochondral chest pain 11/22/2015   Dizziness 05/22/2016   Last Assessment & Plan:  She is going to have meclizine sent in for her and she is aware of the need to have safety due to sleepy effect   Dupuytren's contracture of left hand 10/30/2016   Epiretinal membrane (ERM) of left eye 11/08/2015   Essential hypertension 02/01/2016   Last Assessment & Plan:  Relevant Hx: Course: Daily Update: Today's Plan:this is fair for her right now and with her being sick it is not going to be ideal right now will follow  Electronically signed by: [name], NP 02/16/16 2057   Essential tremor 05/18/2011   Extensor tendonitis of foot 12/25/2018   Gait abnormality 08/15/2017   High risk medication use 05/30/2017   Hip joint pain 12/29/2013   History of dizziness 05/22/2016   Last Assessment & Plan:  She is going to have meclizine sent in for her and she is aware of the need to have safety due to sleepy effect   Hypertensive heart disease with heart failure (HCC) 11/22/2015   Hypertensive retinopathy of both eyes 11/08/2015   Last Assessment & Plan:  Relevant Hx: Course: Daily  Update: Today's Plan:she is following with specialist for this currently  Electronically signed by: [name], NP 02/16/16 2059   Iron deficiency 05/22/2018   Laryngopharyngeal reflux    Left epiretinal membrane 06/26/2016   Left foot pain 04/08/2014   Lumbar radiculopathy, right 06/01/2020   Mixed hyperlipidemia 02/01/2016   Last Assessment & Plan:  Relevant Hx: Course: Daily Update: Today's Plan:there is going to be fasting lipids done   Electronically signed by: [name], NP 02/16/16 2058   Multiple falls 08/15/2017   Nocturnal oxygen desaturation    Non-rheumatic mitral regurgitation 01/08/2016   Overview:  Mild   Osteoporosis 05/18/2011   Other dietary vitamin B12 deficiency anemia 04/15/2018   Other spondylosis with radiculopathy, lumbar region 06/01/2020   Pacemaker 05/18/2011   Manufacturer of Device: [employer]onic  Type of Device: Single Chamber Pacemaker       Posterior vitreous detachment of both eyes 06/26/2016   Presence of cardiac pacemaker 05/18/2011  Pressure injury of skin of dorsum of right foot 04/10/2018   Pseudophakia of both eyes 11/08/2015   PVD (posterior vitreous detachment), both eyes 11/08/2015   Retinal hemorrhage of both eyes 06/26/2016   Rib pain on right side 12/27/2017   S/P revision of total knee 08/10/2013   S/P total knee arthroplasty 12/29/2013   Shortness of breath 12/30/2017   Supplemental oxygen dependent 03/21/2018    Past Surgical History:  Procedure Laterality Date   ABDOMINAL HYSTERECTOMY     BACK SURGERY     FEMUR FRACTURE SURGERY Right    FINGER SURGERY Right    5 finger   PACEMAKER INSERTION     PR PART PALMAR FASCIEC,OPEN 1 DIGIT Left 10/30/2016      REPLACEMENT TOTAL KNEE Right    REVISION TOTAL KNEE ARTHROPLASTY Right    SKIN GRAFT     TONSILLECTOMY     TUBAL LIGATION      Current Medications: Current Meds  Medication Sig   acetaminophen (TYLENOL) 325 MG tablet Take 650 mg by mouth every 6 (six) hours as needed for  mild pain.   albuterol (PROVENTIL HFA;VENTOLIN HFA) 108 (90 BASE) MCG/ACT inhaler Inhale 2 puffs into the lungs every 4 (four) hours as needed for wheezing or shortness of breath.   Ascorbic Acid (VITAMIN C) 1000 MG tablet Take 1 tablet by mouth daily.   Biotin 1 MG CAPS Take 1 capsule by mouth daily.   Cholecalciferol (VITAMIN D3) 125 MCG (5000 UT) CAPS Take 1 capsule by mouth daily.    CRANBERRY EXTRACT PO Take 1 tablet by mouth daily.   cyanocobalamin (,VITAMIN B-12,) 1000 MCG/ML injection Inject 1,000 mcg into the muscle every 14 (fourteen) days.    denosumab (PROLIA) 60 MG/ML SOLN injection Inject 60 mg into the skin every 6 (six) months.   Dextran 70-Hypromellose (ARTIFICIAL TEARS PF OP) Place 1 drop into both eyes 3 (three) times daily.   Docusate Calcium (STOOL SOFTENER PO) Take 2 tablets by mouth daily.   ENTRESTO 49-51 MG Take 1 tablet by mouth 2 (two) times daily.   famotidine (PEPCID) 20 MG tablet Take 20 mg by mouth 2 (two) times daily.   fexofenadine (ALLEGRA) 180 MG tablet Take 180 mg by mouth daily.   furosemide (LASIX) 20 MG tablet 20 mg twice daily for 1 week then take 1 daily alternating with twice daily.   Garlic 1500 MG CAPS Take 1,500 mg by mouth daily.   meclizine (ANTIVERT) 25 MG tablet Take 1 tablet by mouth as needed.   Omega-3 Fatty Acids (OMEGA-3 FISH OIL PO) Take 1 capsule by mouth daily.   ondansetron (ZOFRAN-ODT) 4 MG disintegrating tablet Take 4 mg by mouth every 8 (eight) hours as needed for nausea or vomiting.   polyethylene glycol powder (GLYCOLAX/MIRALAX) 17 GM/SCOOP powder Take 1 Container by mouth daily as needed for mild constipation.   primidone (MYSOLINE) 250 MG tablet Take 250 mg by mouth in the morning and at bedtime.   Probiotic Product (PROBIOTIC DAILY PO) Take 1 tablet by mouth daily.   Propylene Glycol 0.6 % SOLN as needed.   Red Yeast Rice Extract 600 MG CAPS Take 1 capsule by mouth daily.   traMADol (ULTRAM) 50 MG tablet Take 50 mg by mouth  every 6 (six) hours as needed for moderate pain.    triamcinolone acetonide (TRIESENCE) 40 MG/ML SUSP Inject into the articular space as needed for pain.   umeclidinium-vilanterol (ANORO ELLIPTA) 62.5-25 MCG/INH AEPB Inhale 1 puff into the  lungs daily.   vitamin E 1000 UNIT capsule Take 1 capsule by mouth daily.   [DISCONTINUED] naproxen sodium (ALEVE) 220 MG tablet Take 220 mg by mouth daily as needed (pain).   [DISCONTINUED] omeprazole (PRILOSEC) 40 MG capsule Take 1 capsule by mouth every morning.   [DISCONTINUED] TURMERIC PO Take 1 tablet by mouth 2 (two) times daily.     Allergies:   Lidocaine hcl, Procaine, Propranolol, Codeine, Epinephrine, Gabapentin, Levofloxacin, Other, Latex, and Sulfamethoxazole   Social History   Socioeconomic History   Marital status: Divorced    Spouse name: Not on file   Number of children: Not on file   Years of education: Not on file   Highest education level: Not on file  Occupational History   Not on file  Tobacco Use   Smoking status: Former    Packs/day: 1.00    Years: 35.00    Pack years: 35.00    Types: Cigarettes    Quit date: 10/22/1985    Years since quitting: 36.2   Smokeless tobacco: Never  Vaping Use   Vaping Use: Never used  Substance and Sexual Activity   Alcohol use: No   Drug use: No   Sexual activity: Not on file  Other Topics Concern   Not on file  Social History Narrative   Not on file   Social Determinants of Health   Financial Resource Strain: Not on file  Food Insecurity: Not on file  Transportation Needs: Not on file  Physical Activity: Not on file  Stress: Not on file  Social Connections: Not on file     Family History: The patient's family history includes ALS in her mother; Stroke in her sister; Throat cancer in her sister; Ulcers in her father. ROS:   Please see the history of present illness.    All other systems reviewed and are negative.  EKGs/Labs/Other Studies Reviewed:    The following studies  were reviewed today:    Recent Labs: 07/13/2021 creatinine 0.63 potassium 4.4 hemoglobin 12.1  Physical Exam:    VS:  BP 122/60   Pulse 75   Ht 5\' 5"  (1.651 m)   Wt 127 lb (57.6 kg)   SpO2 93%   BMI 21.13 kg/m     Wt Readings from Last 3 Encounters:  12/26/21 127 lb (57.6 kg)  09/04/21 130 lb (59 kg)  09/01/21 129 lb 3.2 oz (58.6 kg)     GEN: She looks very frail poor muscle mass well nourished, well developed in no acute distress HEENT: Normal NECK: No JVD; No carotid bruits LYMPHATICS: No lymphadenopathy CARDIAC: Soft S1 no S3 RRR, no murmurs, rubs, gallops RESPIRATORY:  Clear to auscultation without rales, wheezing or rhonchi  ABDOMEN: Soft, non-tender, non-distended MUSCULOSKELETAL:  No edema; No deformity  SKIN: Warm and dry NEUROLOGIC:  Alert and oriented x 3 PSYCHIATRIC:  Normal affect    Signed, Norman Herrlich, MD  12/26/2021 4:11 PM    [facility]

## 2021-12-27 LAB — BASIC METABOLIC PANEL
BUN/Creatinine Ratio: 18 (ref 12–28)
BUN: 11 mg/dL (ref 8–27)
CO2: 29 mmol/L (ref 20–29)
Calcium: 9.9 mg/dL (ref 8.7–10.3)
Chloride: 97 mmol/L (ref 96–106)
Creatinine, Ser: 0.61 mg/dL (ref 0.57–1.00)
Glucose: 79 mg/dL (ref 70–99)
Potassium: 4.2 mmol/L (ref 3.5–5.2)
Sodium: 140 mmol/L (ref 134–144)
eGFR: 88 mL/min/{1.73_m2} (ref >59)

## 2021-12-27 LAB — PRO B NATRIURETIC PEPTIDE: NT-Pro BNP: 838 pg/mL — ABNORMAL HIGH (ref 0–738)

## 2022-03-20 ENCOUNTER — Ambulatory Visit (INDEPENDENT_AMBULATORY_CARE_PROVIDER_SITE_OTHER): Payer: Medicare Other

## 2022-03-20 DIAGNOSIS — I482 Chronic atrial fibrillation, unspecified: Secondary | ICD-10-CM

## 2022-03-21 ENCOUNTER — Telehealth: Payer: Self-pay

## 2022-03-21 LAB — CUP PACEART REMOTE DEVICE CHECK
Battery Impedance: 5706 Ohm
Battery Remaining Longevity: 5 mo
Battery Voltage: 2.67 V
Brady Statistic RV Percent Paced: 100 %
Date Time Interrogation Session: 20230531103655
Implantable Lead Implant Date: 20050802
Implantable Lead Location: 753860
Implantable Lead Model: 4076
Implantable Pulse Generator Implant Date: 20140717
Lead Channel Impedance Value: 0 Ohm
Lead Channel Impedance Value: 565 Ohm
Lead Channel Pacing Threshold Amplitude: 0.5 V
Lead Channel Pacing Threshold Pulse Width: 0.4 ms
Lead Channel Setting Pacing Amplitude: 2.5 V
Lead Channel Setting Pacing Pulse Width: 0.4 ms
Lead Channel Setting Sensing Sensitivity: 4 mV

## 2022-03-21 NOTE — Telephone Encounter (Signed)
Scheduled remote reviewed. Normal device function.   Battery estimated 36mo  Pt advised.  Monthly battery checks scheduled and Pt documented on her calendar.

## 2022-03-30 ENCOUNTER — Ambulatory Visit (INDEPENDENT_AMBULATORY_CARE_PROVIDER_SITE_OTHER): Payer: Medicare Other | Admitting: Cardiology

## 2022-03-30 ENCOUNTER — Encounter: Payer: Self-pay | Admitting: Cardiology

## 2022-03-30 VITALS — BP 124/76 | HR 78 | Ht 65.0 in | Wt 127.0 lb

## 2022-03-30 DIAGNOSIS — I110 Hypertensive heart disease with heart failure: Secondary | ICD-10-CM

## 2022-03-30 DIAGNOSIS — I482 Chronic atrial fibrillation, unspecified: Secondary | ICD-10-CM | POA: Diagnosis not present

## 2022-03-30 DIAGNOSIS — Z950 Presence of cardiac pacemaker: Secondary | ICD-10-CM

## 2022-03-30 NOTE — Progress Notes (Signed)
Cardiology Office Note:    Date:  03/30/2022   ID:  Kimberly Raymond, DOB 1934-02-19, MRN [id number]  PCP:  Gordan Payment., MD  Cardiologist:  Norman Herrlich, MD    Referring MD: Gordan Payment., MD    ASSESSMENT:    1. Chronic atrial fibrillation (HCC)   2. Pacemaker   3. Hypertensive heart disease with heart failure (HCC)    PLAN:    In order of problems listed above:  Stable rate control with AV nodal ablation pacemaker followed in device clinic she will have more intense surveillance approaching ERI She is quite improved especially functionally has no edema on her current diuretic and is benefited from high-dose Entresto continue the same   Next appointment: 6 months   Medication Adjustments/Labs and Tests Ordered: Current medicines are reviewed at length with the patient today.  Concerns regarding medicines are outlined above.  No orders of the defined types were placed in this encounter.  No orders of the defined types were placed in this encounter.   Chief complaint follow-up heart failure atrial fibrillation   History of Present Illness:    Kimberly Raymond is a 88 y.o. female with a hx of  chronic atrial fibrillation with AV nodal ablation and pacemaker for rate control COPD sleep apnea and hypertensive heart disease with heart failure and mildly reduced ejection fraction 45 to 50%  last seen 12/26/2021.  Compliance with diet, lifestyle and medications: Yes  Overall she is doing better more functional and less short of breath her weight is stable no edema orthopnea chest pain palpitation or syncope. She had lab work done 02/22/2022 CMP with a potassium 4.5 creatinine 0.5 GFR 88 cc Cholesterol 177 LDL 89 Hemoglobin 12.6  Device check 03/21/2022 shows her to be 100% RV paced and she is within 5 months projected ERI Past Medical History:  Diagnosis Date   Acute bronchitis, complicated 09/11/2018   Acute exacerbation of COPD with asthma (HCC) 02/01/2016   Last  Assessment & Plan:  Formatting of this note might be different from the original. Relevant Hx: Course: Daily Update: Today's Plan:she is going to have labs, and CXR as well as she is going to have oral abx called in and she is going to have BNP and pulmonary referral. She is not using any oxygen now as she sent it back due to the cost , she has not been using an inhaled steroid as she only go   Age-related osteoporosis without current pathological fracture 05/18/2011   Last Assessment & Plan:  Relevant Hx: Course: Daily Update: Today's Plan:she is following with Dr. Albertha Ghee for this and recieves prolia twice a year and is due for this in May  Electronically signed by: [name], NP 02/16/16 2101   Allergic rhinitis    Amblyopia, right eye 11/08/2015   Aortic atherosclerosis (HCC) 02/17/2018   Arthritis 05/18/2011   Branch retinal vein occlusion of left eye 11/08/2015   Cardiac pacemaker in situ 05/18/2011   Cerebral thrombosis with cerebral infarction (HCC) 05/18/2011   Chronic atrial fibrillation (HCC) 11/22/2015   Overview:  With His ablation and permanent pacemaker for rate control, CHADS2 vasc score= 7  Overview:  Overview:  With His ablation and permanent pacemaker for rate control, CHADS2 vasc score= 7  Last Assessment & Plan:  Relevant Hx: Course: Daily Update: Today's Plan:she has pacemaker and is no longer taking eliquis   Electronically signed by: [name], NP 02/16/16 2059   Chronic  bilateral back pain 08/14/2017   Chronic diastolic heart failure (HCC) 11/22/2015   Chronic edema 04/01/2017   Chronic obstructive pulmonary disease (HCC) 10/18/2016   Chronic pansinusitis 05/22/2018   Chronic respiratory failure with hypoxia (HCC) 03/06/2018   Chronic rhinitis 05/22/2018   Contracture of finger joint, left 10/30/2016   COPD (chronic obstructive pulmonary disease) (HCC)    Coronary artery calcification seen on CAT scan 04/01/2018   Seen on CT scan March 2019 with  three-vessel calcification   Coronary artery disease involving native coronary artery of native heart with angina pectoris (HCC) 04/01/2018   Formatting of this note might be different from the original. Seen on CT scan March 2019 with three-vessel calcification   Costochondral chest pain 11/22/2015   Dizziness 05/22/2016   Last Assessment & Plan:  She is going to have meclizine sent in for her and she is aware of the need to have safety due to sleepy effect   Dupuytren's contracture of left hand 10/30/2016   Epiretinal membrane (ERM) of left eye 11/08/2015   Essential hypertension 02/01/2016   Last Assessment & Plan:  Relevant Hx: Course: Daily Update: Today's Plan:this is fair for her right now and with her being sick it is not going to be ideal right now will follow  Electronically signed by: Krystal ClarkMelissa Joyce Brown-Patram, NP 02/16/16 2057   Essential tremor 05/18/2011   Extensor tendonitis of foot 12/25/2018   Gait abnormality 08/15/2017   High risk medication use 05/30/2017   Hip joint pain 12/29/2013   History of dizziness 05/22/2016   Last Assessment & Plan:  She is going to have meclizine sent in for her and she is aware of the need to have safety due to sleepy effect   Hypertensive heart disease with heart failure (HCC) 11/22/2015   Hypertensive retinopathy of both eyes 11/08/2015   Last Assessment & Plan:  Relevant Hx: Course: Daily Update: Today's Plan:she is following with specialist for this currently  Electronically signed by: Krystal ClarkMelissa Joyce Brown-Patram, NP 02/16/16 2059   Iron deficiency 05/22/2018   Laryngopharyngeal reflux    Left epiretinal membrane 06/26/2016   Left foot pain 04/08/2014   Lumbar radiculopathy, right 06/01/2020   Mixed hyperlipidemia 02/01/2016   Last Assessment & Plan:  Relevant Hx: Course: Daily Update: Today's Plan:there is going to be fasting lipids done   Electronically signed by: Krystal ClarkMelissa Joyce Brown-Patram, NP 02/16/16 2058   Multiple falls 08/15/2017   Nocturnal oxygen  desaturation    Non-rheumatic mitral regurgitation 01/08/2016   Overview:  Mild   Osteoporosis 05/18/2011   Other dietary vitamin B12 deficiency anemia 04/15/2018   Other spondylosis with radiculopathy, lumbar region 06/01/2020   Pacemaker 05/18/2011   Manufacturer of Device: Medtronic  Type of Device: Single Chamber Pacemaker       Posterior vitreous detachment of both eyes 06/26/2016   Presence of cardiac pacemaker 05/18/2011   Pressure injury of skin of dorsum of right foot 04/10/2018   Pseudophakia of both eyes 11/08/2015   PVD (posterior vitreous detachment), both eyes 11/08/2015   Retinal hemorrhage of both eyes 06/26/2016   Rib pain on right side 12/27/2017   S/P revision of total knee 08/10/2013   S/P total knee arthroplasty 12/29/2013   Shortness of breath 12/30/2017   Supplemental oxygen dependent 03/21/2018    Past Surgical History:  Procedure Laterality Date   ABDOMINAL HYSTERECTOMY     BACK SURGERY     FEMUR FRACTURE SURGERY Right    FINGER SURGERY Right  5 finger   PACEMAKER INSERTION     PR PART PALMAR FASCIEC,OPEN 1 DIGIT Left 10/30/2016      REPLACEMENT TOTAL KNEE Right    REVISION TOTAL KNEE ARTHROPLASTY Right    SKIN GRAFT     TONSILLECTOMY     TUBAL LIGATION      Current Medications: Current Meds  Medication Sig   acetaminophen (TYLENOL) 325 MG tablet Take 650 mg by mouth every 6 (six) hours as needed for mild pain.   albuterol (PROVENTIL HFA;VENTOLIN HFA) 108 (90 BASE) MCG/ACT inhaler Inhale 2 puffs into the lungs every 4 (four) hours as needed for wheezing or shortness of breath.   Ascorbic Acid (VITAMIN C) 1000 MG tablet Take 1 tablet by mouth daily.   Biotin 1 MG CAPS Take 1 capsule by mouth daily.   Cholecalciferol (VITAMIN D3) 125 MCG (5000 UT) CAPS Take 1 capsule by mouth daily.    CRANBERRY EXTRACT PO Take 1 tablet by mouth daily.   cyanocobalamin (,VITAMIN B-12,) 1000 MCG/ML injection Inject 1,000 mcg into the muscle every 14 (fourteen) days.    denosumab  (PROLIA) 60 MG/ML SOLN injection Inject 60 mg into the skin every 6 (six) months.   Dextran 70-Hypromellose (ARTIFICIAL TEARS PF OP) Place 1 drop into both eyes 3 (three) times daily.   Docusate Calcium (STOOL SOFTENER PO) Take 2 tablets by mouth daily.   famotidine (PEPCID) 20 MG tablet Take 20 mg by mouth 2 (two) times daily.   fexofenadine (ALLEGRA) 180 MG tablet Take 180 mg by mouth daily.   furosemide (LASIX) 20 MG tablet 20 mg twice daily for 1 week then take 1 daily alternating with twice daily.   Garlic 1500 MG CAPS Take 1,500 mg by mouth daily.   meclizine (ANTIVERT) 25 MG tablet Take 1 tablet by mouth as needed.   Omega-3 Fatty Acids (OMEGA-3 FISH OIL PO) Take 1 capsule by mouth daily.   omeprazole (PRILOSEC) 40 MG capsule Take 40 mg by mouth every morning.   ondansetron (ZOFRAN-ODT) 4 MG disintegrating tablet Take 4 mg by mouth every 8 (eight) hours as needed for nausea or vomiting.   OVER THE COUNTER MEDICATION Take 1 tablet by mouth daily. NERVIVE   polyethylene glycol powder (GLYCOLAX/MIRALAX) 17 GM/SCOOP powder Take 1 Container by mouth daily as needed for mild constipation.   primidone (MYSOLINE) 250 MG tablet Take 250 mg by mouth in the morning and at bedtime.   Probiotic Product (PROBIOTIC DAILY PO) Take 1 tablet by mouth daily.   Propylene Glycol 0.6 % SOLN as needed.   Red Yeast Rice Extract 600 MG CAPS Take 1 capsule by mouth daily.   sacubitril-valsartan (ENTRESTO) 97-103 MG Take 1 tablet by mouth 2 (two) times daily.   traMADol (ULTRAM) 50 MG tablet Take 50 mg by mouth every 6 (six) hours as needed for moderate pain.    umeclidinium-vilanterol (ANORO ELLIPTA) 62.5-25 MCG/INH AEPB Inhale 1 puff into the lungs daily.   vitamin E 1000 UNIT capsule Take 1 capsule by mouth daily.     Allergies:   Lidocaine hcl, Procaine, Propranolol, Codeine, Epinephrine, Gabapentin, Levofloxacin, Other, Latex, and Sulfamethoxazole   Social History   Socioeconomic History   Marital  status: Divorced    Spouse name: Not on file   Number of children: Not on file   Years of education: Not on file   Highest education level: Not on file  Occupational History   Not on file  Tobacco Use   Smoking status: Former  Packs/day: 1.00    Years: 35.00    Total pack years: 35.00    Types: Cigarettes    Quit date: 10/22/1985    Years since quitting: 36.4   Smokeless tobacco: Never  Vaping Use   Vaping Use: Never used  Substance and Sexual Activity   Alcohol use: No   Drug use: No   Sexual activity: Not on file  Other Topics Concern   Not on file  Social History Narrative   Not on file   Social Determinants of Health   Financial Resource Strain: Not on file  Food Insecurity: Not on file  Transportation Needs: Not on file  Physical Activity: Not on file  Stress: Not on file  Social Connections: Not on file     Family History: The patient's family history includes ALS in her mother; Stroke in her sister; Throat cancer in her sister; Ulcers in her father. ROS:   Please see the history of present illness.    All other systems reviewed and are negative.  EKGs/Labs/Other Studies Reviewed:    The following studies were reviewed today:  See history  Physical Exam:    VS:  BP 124/76 (BP Location: Right Arm, Patient Position: Sitting)   Pulse 78   Ht 5\' 5"  (1.651 m)   Wt 127 lb (57.6 kg)   SpO2 91%   BMI 21.13 kg/m     Wt Readings from Last 3 Encounters:  03/30/22 127 lb (57.6 kg)  12/26/21 127 lb (57.6 kg)  09/04/21 130 lb (59 kg)     GEN: Appears frail but stronger than last visit well nourished, well developed in no acute distress HEENT: Normal NECK: No JVD; No carotid bruits LYMPHATICS: No lymphadenopathy CARDIAC: RRR, no murmurs, rubs, gallops RESPIRATORY:  Clear to auscultation without rales, wheezing or rhonchi  ABDOMEN: Soft, non-tender, non-distended MUSCULOSKELETAL:  No edema; No deformity  SKIN: Warm and dry NEUROLOGIC:  Alert and  oriented x 3 PSYCHIATRIC:  Normal affect    Signed, 09/06/21, MD  03/30/2022 2:30 PM    [facility]

## 2022-03-30 NOTE — Patient Instructions (Signed)

## 2022-04-04 NOTE — Progress Notes (Signed)
Remote pacemaker transmission.   

## 2022-04-23 ENCOUNTER — Ambulatory Visit (INDEPENDENT_AMBULATORY_CARE_PROVIDER_SITE_OTHER): Payer: Medicare Other

## 2022-04-23 DIAGNOSIS — I482 Chronic atrial fibrillation, unspecified: Secondary | ICD-10-CM

## 2022-04-26 LAB — CUP PACEART REMOTE DEVICE CHECK
Battery Impedance: 5542 Ohm
Battery Remaining Longevity: 6 mo
Battery Voltage: 2.67 V
Brady Statistic RV Percent Paced: 100 %
Date Time Interrogation Session: 20230705145654
Implantable Lead Implant Date: 20050802
Implantable Lead Location: 753860
Implantable Lead Model: 4076
Implantable Pulse Generator Implant Date: 20140717
Lead Channel Impedance Value: 0 Ohm
Lead Channel Impedance Value: 522 Ohm
Lead Channel Pacing Threshold Amplitude: 0.5 V
Lead Channel Pacing Threshold Pulse Width: 0.4 ms
Lead Channel Setting Pacing Amplitude: 2.5 V
Lead Channel Setting Pacing Pulse Width: 0.4 ms
Lead Channel Setting Sensing Sensitivity: 4 mV

## 2022-05-16 NOTE — Addendum Note (Signed)
Addended by: Geralyn Flash D on: 05/16/2022 02:29 PM   Modules accepted: Level of Service

## 2022-05-16 NOTE — Progress Notes (Signed)
Remote pacemaker transmission.   

## 2022-05-24 ENCOUNTER — Ambulatory Visit (INDEPENDENT_AMBULATORY_CARE_PROVIDER_SITE_OTHER): Payer: Medicare Other

## 2022-05-24 DIAGNOSIS — I482 Chronic atrial fibrillation, unspecified: Secondary | ICD-10-CM

## 2022-05-25 LAB — CUP PACEART REMOTE DEVICE CHECK
Battery Impedance: 5745 Ohm
Battery Remaining Longevity: 5 mo
Battery Voltage: 2.66 V
Brady Statistic RV Percent Paced: 100 %
Date Time Interrogation Session: 20230803151417
Implantable Lead Implant Date: 20050802
Implantable Lead Location: 753860
Implantable Lead Model: 4076
Implantable Pulse Generator Implant Date: 20140717
Lead Channel Impedance Value: 0 Ohm
Lead Channel Impedance Value: 540 Ohm
Lead Channel Pacing Threshold Amplitude: 0.5 V
Lead Channel Pacing Threshold Pulse Width: 0.4 ms
Lead Channel Setting Pacing Amplitude: 2.5 V
Lead Channel Setting Pacing Pulse Width: 0.4 ms
Lead Channel Setting Sensing Sensitivity: 4 mV

## 2022-06-15 NOTE — Addendum Note (Signed)
Addended by: Elease Etienne A on: 06/15/2022 09:11 AM   Modules accepted: Level of Service

## 2022-06-15 NOTE — Progress Notes (Signed)
Remote pacemaker transmission.   

## 2022-06-26 ENCOUNTER — Other Ambulatory Visit: Payer: Self-pay | Admitting: Cardiology

## 2022-06-26 ENCOUNTER — Ambulatory Visit (INDEPENDENT_AMBULATORY_CARE_PROVIDER_SITE_OTHER): Payer: Medicare Other

## 2022-06-26 DIAGNOSIS — I482 Chronic atrial fibrillation, unspecified: Secondary | ICD-10-CM

## 2022-06-26 NOTE — Telephone Encounter (Signed)
Entresto 97 mg - 103 mg # 60 x 5 refills sent to [facility] - [place], [place] - [address]

## 2022-07-03 ENCOUNTER — Encounter: Payer: Self-pay | Admitting: Cardiology

## 2022-07-03 LAB — CUP PACEART REMOTE DEVICE CHECK
Battery Impedance: 5838 Ohm
Battery Remaining Longevity: 5 mo
Battery Voltage: 2.65 V
Brady Statistic RV Percent Paced: 100 %
Date Time Interrogation Session: 20230908132809
Implantable Lead Implant Date: 20050802
Implantable Lead Location: 753860
Implantable Lead Model: 4076
Implantable Pulse Generator Implant Date: 20140717
Lead Channel Impedance Value: 0 Ohm
Lead Channel Impedance Value: 561 Ohm
Lead Channel Pacing Threshold Amplitude: 0.5 V
Lead Channel Pacing Threshold Pulse Width: 0.4 ms
Lead Channel Setting Pacing Amplitude: 2.5 V
Lead Channel Setting Pacing Pulse Width: 0.4 ms
Lead Channel Setting Sensing Sensitivity: 4 mV

## 2022-07-04 NOTE — Progress Notes (Deleted)
Cardiology Office Note:    Date:  07/04/2022   ID:  Kimberly Raymond, DOB January 26, 1934, MRN [id number]  PCP:  Gordan Payment., MD  Cardiologist:  Norman Herrlich, MD    Referring MD: Gordan Payment., MD    ASSESSMENT:    No diagnosis found. PLAN:    In order of problems listed above:  ***   Next appointment: ***   Medication Adjustments/Labs and Tests Ordered: Current medicines are reviewed at length with the patient today.  Concerns regarding medicines are outlined above.  No orders of the defined types were placed in this encounter.  No orders of the defined types were placed in this encounter.   No chief complaint on file.   History of Present Illness:    Kimberly Raymond is a 88 y.o. female with a hx of chronic atrial fibrillation with AV nodal ablation for rate control and pacemaker, COPD sleep apnea and hypertensive heart disease with heart failure mildly reduced ejection fraction 45 to 50% last seen 03/30/2022. Compliance with diet, lifestyle and medications: *** Past Medical History:  Diagnosis Date   Acute bronchitis, complicated 09/11/2018   Acute exacerbation of COPD with asthma (HCC) 02/01/2016   Last Assessment & Plan:  Formatting of this note might be different from the original. Relevant Hx: Course: Daily Update: Today's Plan:she is going to have labs, and CXR as well as she is going to have oral abx called in and she is going to have BNP and pulmonary referral. She is not using any oxygen now as she sent it back due to the cost , she has not been using an inhaled steroid as she only go   Age-related osteoporosis without current pathological fracture 05/18/2011   Last Assessment & Plan:  Relevant Hx: Course: Daily Update: Today's Plan:she is following with Dr. Albertha Ghee for this and recieves prolia twice a year and is due for this in May  Electronically signed by: [name], NP 02/16/16 2101   Allergic rhinitis    Amblyopia, right eye 11/08/2015    Aortic atherosclerosis (HCC) 02/17/2018   Arthritis 05/18/2011   Branch retinal vein occlusion of left eye 11/08/2015   Cardiac pacemaker in situ 05/18/2011   Cerebral thrombosis with cerebral infarction (HCC) 05/18/2011   Chronic atrial fibrillation (HCC) 11/22/2015   Overview:  With His ablation and permanent pacemaker for rate control, CHADS2 vasc score= 7  Overview:  Overview:  With His ablation and permanent pacemaker for rate control, CHADS2 vasc score= 7  Last Assessment & Plan:  Relevant Hx: Course: Daily Update: Today's Plan:she has pacemaker and is no longer taking eliquis   Electronically signed by: [name], NP 02/16/16 2059   Chronic bilateral back pain 08/14/2017   Chronic diastolic heart failure (HCC) 11/22/2015   Chronic edema 04/01/2017   Chronic obstructive pulmonary disease (HCC) 10/18/2016   Chronic pansinusitis 05/22/2018   Chronic respiratory failure with hypoxia (HCC) 03/06/2018   Chronic rhinitis 05/22/2018   Contracture of finger joint, left 10/30/2016   COPD (chronic obstructive pulmonary disease) (HCC)    Coronary artery calcification seen on CAT scan 04/01/2018   Seen on CT scan March 2019 with three-vessel calcification   Coronary artery disease involving native coronary artery of native heart with angina pectoris (HCC) 04/01/2018   Formatting of this note might be different from the original. Seen on CT scan March 2019 with three-vessel calcification   Costochondral chest pain 11/22/2015   Dizziness 05/22/2016   Last  Assessment & Plan:  She is going to have meclizine sent in for her and she is aware of the need to have safety due to sleepy effect   Dupuytren's contracture of left hand 10/30/2016   Epiretinal membrane (ERM) of left eye 11/08/2015   Essential hypertension 02/01/2016   Last Assessment & Plan:  Relevant Hx: Course: Daily Update: Today's Plan:this is fair for her right now and with her being sick it is not going to be ideal right now will follow   Electronically signed by: [name], NP 02/16/16 2057   Essential tremor 05/18/2011   Extensor tendonitis of foot 12/25/2018   Gait abnormality 08/15/2017   High risk medication use 05/30/2017   Hip joint pain 12/29/2013   History of dizziness 05/22/2016   Last Assessment & Plan:  She is going to have meclizine sent in for her and she is aware of the need to have safety due to sleepy effect   Hypertensive heart disease with heart failure (HCC) 11/22/2015   Hypertensive retinopathy of both eyes 11/08/2015   Last Assessment & Plan:  Relevant Hx: Course: Daily Update: Today's Plan:she is following with specialist for this currently  Electronically signed by: [name], NP 02/16/16 2059   Iron deficiency 05/22/2018   Laryngopharyngeal reflux    Left epiretinal membrane 06/26/2016   Left foot pain 04/08/2014   Lumbar radiculopathy, right 06/01/2020   Mixed hyperlipidemia 02/01/2016   Last Assessment & Plan:  Relevant Hx: Course: Daily Update: Today's Plan:there is going to be fasting lipids done   Electronically signed by: [name], NP 02/16/16 2058   Multiple falls 08/15/2017   Nocturnal oxygen desaturation    Non-rheumatic mitral regurgitation 01/08/2016   Overview:  Mild   Osteoporosis 05/18/2011   Other dietary vitamin B12 deficiency anemia 04/15/2018   Other spondylosis with radiculopathy, lumbar region 06/01/2020   Pacemaker 05/18/2011   Manufacturer of Device: Medtronic  Type of Device: Single Chamber Pacemaker       Posterior vitreous detachment of both eyes 06/26/2016   Presence of cardiac pacemaker 05/18/2011   Pressure injury of skin of dorsum of right foot 04/10/2018   Pseudophakia of both eyes 11/08/2015   PVD (posterior vitreous detachment), both eyes 11/08/2015   Retinal hemorrhage of both eyes 06/26/2016   Rib pain on right side 12/27/2017   S/P revision of total knee 08/10/2013   S/P total knee arthroplasty 12/29/2013   Shortness of breath 12/30/2017    Supplemental oxygen dependent 03/21/2018    Past Surgical History:  Procedure Laterality Date   ABDOMINAL HYSTERECTOMY     BACK SURGERY     FEMUR FRACTURE SURGERY Right    FINGER SURGERY Right    5 finger   PACEMAKER INSERTION     PR PART PALMAR FASCIEC,OPEN 1 DIGIT Left 10/30/2016      REPLACEMENT TOTAL KNEE Right    REVISION TOTAL KNEE ARTHROPLASTY Right    SKIN GRAFT     TONSILLECTOMY     TUBAL LIGATION      Current Medications: No outpatient medications have been marked as taking for the 07/05/22 encounter (Appointment) with Baldo Daub, MD.     Allergies:   Lidocaine hcl, Procaine, Propranolol, Codeine, Epinephrine, Gabapentin, Levofloxacin, Other, Latex, and Sulfamethoxazole   Social History   Socioeconomic History   Marital status: Divorced    Spouse name: Not on file   Number of children: Not on file   Years of education: Not on file  Highest education level: Not on file  Occupational History   Not on file  Tobacco Use   Smoking status: Former    Packs/day: 1.00    Years: 35.00    Total pack years: 35.00    Types: Cigarettes    Quit date: 10/22/1985    Years since quitting: 36.7   Smokeless tobacco: Never  Vaping Use   Vaping Use: Never used  Substance and Sexual Activity   Alcohol use: No   Drug use: No   Sexual activity: Not on file  Other Topics Concern   Not on file  Social History Narrative   Not on file   Social Determinants of Health   Financial Resource Strain: Not on file  Food Insecurity: Not on file  Transportation Needs: Not on file  Physical Activity: Not on file  Stress: Not on file  Social Connections: Not on file     Family History: The patient's ***family history includes ALS in her mother; Stroke in her sister; Throat cancer in her sister; Ulcers in her father. ROS:   Please see the history of present illness.    All other systems reviewed and are negative.  EKGs/Labs/Other Studies Reviewed:    The following studies  were reviewed today:  EKG:  EKG ordered today and personally reviewed.  The ekg ordered today demonstrates ***  Recent Labs: 12/26/2021: BUN 11; Creatinine, Ser 0.61; NT-Pro BNP 838; Potassium 4.2; Sodium 140  Recent Lipid Panel No results found for: "CHOL", "TRIG", "HDL", "CHOLHDL", "VLDL", "LDLCALC", "LDLDIRECT"  Physical Exam:    VS:  There were no vitals taken for this visit.    Wt Readings from Last 3 Encounters:  03/30/22 127 lb (57.6 kg)  12/26/21 127 lb (57.6 kg)  09/04/21 130 lb (59 kg)     GEN: *** Well nourished, well developed in no acute distress HEENT: Normal NECK: No JVD; No carotid bruits LYMPHATICS: No lymphadenopathy CARDIAC: ***RRR, no murmurs, rubs, gallops RESPIRATORY:  Clear to auscultation without rales, wheezing or rhonchi  ABDOMEN: Soft, non-tender, non-distended MUSCULOSKELETAL:  No edema; No deformity  SKIN: Warm and dry NEUROLOGIC:  Alert and oriented x 3 PSYCHIATRIC:  Normal affect    Signed, Norman Herrlich, MD  07/04/2022 1:26 PM     [facility]

## 2022-07-05 ENCOUNTER — Ambulatory Visit: Payer: Medicare Other | Admitting: Cardiology

## 2022-07-19 NOTE — Addendum Note (Signed)
Addended by: Douglass Rivers D on: 07/19/2022 01:35 PM   Modules accepted: Level of Service

## 2022-07-19 NOTE — Progress Notes (Signed)
Remote pacemaker transmission.   

## 2022-07-26 ENCOUNTER — Ambulatory Visit (INDEPENDENT_AMBULATORY_CARE_PROVIDER_SITE_OTHER): Payer: Medicare Other

## 2022-07-26 DIAGNOSIS — I6330 Cerebral infarction due to thrombosis of unspecified cerebral artery: Secondary | ICD-10-CM

## 2022-07-27 LAB — CUP PACEART REMOTE DEVICE CHECK
Battery Impedance: 6199 Ohm
Battery Remaining Longevity: 4 mo
Battery Voltage: 2.64 V
Brady Statistic RV Percent Paced: 100 %
Date Time Interrogation Session: 20231005180826
Implantable Lead Implant Date: 20050802
Implantable Lead Location: 753860
Implantable Lead Model: 4076
Implantable Pulse Generator Implant Date: 20140717
Lead Channel Impedance Value: 0 Ohm
Lead Channel Impedance Value: 563 Ohm
Lead Channel Pacing Threshold Amplitude: 0.5 V
Lead Channel Pacing Threshold Pulse Width: 0.4 ms
Lead Channel Setting Pacing Amplitude: 2.5 V
Lead Channel Setting Pacing Pulse Width: 0.4 ms
Lead Channel Setting Sensing Sensitivity: 4 mV

## 2022-08-02 NOTE — Addendum Note (Signed)
Addended by: Cheri Kearns A on: 08/02/2022 10:24 AM   Modules accepted: Level of Service

## 2022-08-02 NOTE — Progress Notes (Signed)
Remote pacemaker transmission.   

## 2022-08-09 DIAGNOSIS — E440 Moderate protein-calorie malnutrition: Secondary | ICD-10-CM | POA: Insufficient documentation

## 2022-08-09 HISTORY — DX: Moderate protein-calorie malnutrition: E44.0

## 2022-08-27 ENCOUNTER — Telehealth: Payer: Self-pay | Admitting: Cardiology

## 2022-08-27 ENCOUNTER — Ambulatory Visit (INDEPENDENT_AMBULATORY_CARE_PROVIDER_SITE_OTHER): Payer: Medicare Other

## 2022-08-27 DIAGNOSIS — I482 Chronic atrial fibrillation, unspecified: Secondary | ICD-10-CM

## 2022-08-27 NOTE — Telephone Encounter (Signed)
Pt c/o Shortness Of Breath: STAT if SOB developed within the last 24 hours or pt is noticeably SOB on the phone  1. Are you currently SOB (can you hear that pt is SOB on the phone)?  Not currently with the patient  2. How long have you been experiencing SOB?  Past month  3. Are you SOB when sitting or when up moving around?  Both   4. Are you currently experiencing any other symptoms?  Weakness/tiredness

## 2022-08-27 NOTE — Telephone Encounter (Signed)
Spoke with pam Mckinnon per DPR who reports shob is not better since RH dc. Appt make with Dr. Agustin Cree since Dr. Bettina Gavia is out of town. No significant weight gain and pt is taking her Lasix.

## 2022-08-28 LAB — CUP PACEART REMOTE DEVICE CHECK
Battery Impedance: 6672 Ohm
Battery Remaining Longevity: 2 mo
Battery Voltage: 2.64 V
Brady Statistic RV Percent Paced: 100 %
Date Time Interrogation Session: 20231106130906
Implantable Lead Connection Status: 753985
Implantable Lead Implant Date: 20050802
Implantable Lead Location: 753860
Implantable Lead Model: 4076
Implantable Pulse Generator Implant Date: 20140717
Lead Channel Impedance Value: 0 Ohm
Lead Channel Impedance Value: 545 Ohm
Lead Channel Pacing Threshold Amplitude: 0.5 V
Lead Channel Pacing Threshold Pulse Width: 0.4 ms
Lead Channel Setting Pacing Amplitude: 2.5 V
Lead Channel Setting Pacing Pulse Width: 0.4 ms
Lead Channel Setting Sensing Sensitivity: 4 mV
Zone Setting Status: 755011

## 2022-08-29 ENCOUNTER — Ambulatory Visit: Payer: Medicare Other | Attending: Cardiology | Admitting: Cardiology

## 2022-08-29 ENCOUNTER — Encounter: Payer: Self-pay | Admitting: Cardiology

## 2022-08-29 VITALS — BP 114/76 | HR 77 | Ht 65.0 in | Wt 127.6 lb

## 2022-08-29 DIAGNOSIS — I110 Hypertensive heart disease with heart failure: Secondary | ICD-10-CM | POA: Diagnosis not present

## 2022-08-29 DIAGNOSIS — R0602 Shortness of breath: Secondary | ICD-10-CM

## 2022-08-29 DIAGNOSIS — J42 Unspecified chronic bronchitis: Secondary | ICD-10-CM | POA: Diagnosis not present

## 2022-08-29 DIAGNOSIS — I5032 Chronic diastolic (congestive) heart failure: Secondary | ICD-10-CM

## 2022-08-29 DIAGNOSIS — I482 Chronic atrial fibrillation, unspecified: Secondary | ICD-10-CM

## 2022-08-29 NOTE — Patient Instructions (Addendum)
Medication Instructions:  Your physician recommends that you continue on your current medications as directed. Please refer to the Current Medication list given to you today.  *If you need a refill on your cardiac medications before your next appointment, please call your pharmacy*   Lab Work: BMP, ProBNP- Today If you have labs (blood work) drawn today and your tests are completely normal, you will receive your results only by: MyChart Message (if you have MyChart) OR A paper copy in the mail If you have any lab test that is abnormal or we need to change your treatment, we will call you to review the results.   Testing/Procedures: None Ordered   Follow-Up: At [facility], you and your health needs are our priority.  As part of our continuing mission to provide you with exceptional heart care, we have created designated Provider Care Teams.  These Care Teams include your primary Cardiologist (physician) and Advanced Practice Providers (APPs -  Physician Assistants and Nurse Practitioners) who all work together to provide you with the care you need, when you need it.  We recommend signing up for the patient portal called "MyChart".  Sign up information is provided on this After Visit Summary.  MyChart is used to connect with patients for Virtual Visits (Telemedicine).  Patients are able to view lab/test results, encounter notes, upcoming appointments, etc.  Non-urgent messages can be sent to your provider as well.   To learn more about what you can do with MyChart, go to [email or web address].    Your next appointment:   3 month(s)  The format for your next appointment:   In Person  Provider:   Norman Herrlich, MD   Other Instructions NA

## 2022-08-29 NOTE — Progress Notes (Signed)
Cardiology Office Note:    Date:  08/29/2022   ID:  Kimberly Raymond, DOB 03-23-1934, MRN [id number]  PCP:  Gordan Payment., MD  Cardiologist:  Gypsy Balsam, MD    Referring MD: Gordan Payment., MD   Chief Complaint  Patient presents with   [facility] follow up    History of Present Illness:    Kimberly Raymond is a 88 y.o. female with past medical history significant for advanced COPD, she quit smoking in 1987, hypertensive heart disease with mildly reduced left ventricle ejection fraction based on echo from before, permanent atrial fibrillation, Medtronic pacemaker, VVI, status post AV nodal ablation, Comes today to my office after recent hospitalization.  Reason for hospitalization was decompensated diastolic congestive heart failure as well as exacerbation of COPD.  She was given some extra Lasix with good diuresis improved clinically.  Coming today to office for follow-up doing well.  Denies have any chest pain tightness squeezing pressure burning chest shortness of breath is still there but she said that majority of time she feels well.  She was taking initially higher dose of diuretic she was taking 40 mg of Lasix now back to 20 daily.  Alternate with 40.  Denies have any chest pain tightness squeezing pressure burning chest no palpitations no dizziness  Past Medical History:  Diagnosis Date   Acute bronchitis, complicated 09/11/2018   Acute exacerbation of COPD with asthma (HCC) 02/01/2016   Last Assessment & Plan:  Formatting of this note might be different from the original. Relevant Hx: Course: Daily Update: Today's Plan:she is going to have labs, and CXR as well as she is going to have oral abx called in and she is going to have BNP and pulmonary referral. She is not using any oxygen now as she sent it back due to the cost , she has not been using an inhaled steroid as she only go   Age-related osteoporosis without current pathological fracture 05/18/2011   Last Assessment & Plan:   Relevant Hx: Course: Daily Update: Today's Plan:she is following with Dr. Albertha Ghee for this and recieves prolia twice a year and is due for this in May  Electronically signed by: [name], NP 02/16/16 2101   Allergic rhinitis    Amblyopia, right eye 11/08/2015   Aortic atherosclerosis (HCC) 02/17/2018   Arthritis 05/18/2011   Branch retinal vein occlusion of left eye 11/08/2015   Cardiac pacemaker in situ 05/18/2011   Cerebral thrombosis with cerebral infarction (HCC) 05/18/2011   Chronic atrial fibrillation (HCC) 11/22/2015   Overview:  With His ablation and permanent pacemaker for rate control, CHADS2 vasc score= 7  Overview:  Overview:  With His ablation and permanent pacemaker for rate control, CHADS2 vasc score= 7  Last Assessment & Plan:  Relevant Hx: Course: Daily Update: Today's Plan:she has pacemaker and is no longer taking eliquis   Electronically signed by: [name], NP 02/16/16 2059   Chronic bilateral back pain 08/14/2017   Chronic diastolic heart failure (HCC) 11/22/2015   Chronic edema 04/01/2017   Chronic obstructive pulmonary disease (HCC) 10/18/2016   Chronic pain of left knee 11/22/2020   Chronic pansinusitis 05/22/2018   Chronic respiratory failure with hypoxia (HCC) 03/06/2018   Chronic rhinitis 05/22/2018   Contracture of finger joint, left 10/30/2016   COPD (chronic obstructive pulmonary disease) (HCC)    Coronary artery calcification seen on CAT scan 04/01/2018   Seen on CT scan March 2019 with three-vessel calcification  Coronary artery disease involving native coronary artery of native heart with angina pectoris (HCC) 04/01/2018   Formatting of this note might be different from the original. Seen on CT scan March 2019 with three-vessel calcification   Costochondral chest pain 11/22/2015   Dizziness 05/22/2016   Last Assessment & Plan:  She is going to have meclizine sent in for her and she is aware of the need to have  safety due to sleepy effect   Dupuytren's contracture of left hand 10/30/2016   Epiretinal membrane (ERM) of left eye 11/08/2015   Essential hypertension 02/01/2016   Last Assessment & Plan:  Relevant Hx: Course: Daily Update: Today's Plan:this is fair for her right now and with her being sick it is not going to be ideal right now will follow  Electronically signed by: [name], NP 02/16/16 2057   Essential tremor 05/18/2011   Extensor tendonitis of foot 12/25/2018   Gait abnormality 08/15/2017   High risk medication use 05/30/2017   Hip joint pain 12/29/2013   History of dizziness 05/22/2016   Last Assessment & Plan:  She is going to have meclizine sent in for her and she is aware of the need to have safety due to sleepy effect   Hypertensive heart disease with heart failure (HCC) 11/22/2015   Hypertensive retinopathy of both eyes 11/08/2015   Last Assessment & Plan:  Relevant Hx: Course: Daily Update: Today's Plan:she is following with specialist for this currently  Electronically signed by: [name], NP 02/16/16 2059   Iron deficiency 05/22/2018   Laryngopharyngeal reflux    Left epiretinal membrane 06/26/2016   Left foot pain 04/08/2014   Lumbar facet joint syndrome 12/16/2020   Lumbar radiculopathy, right 06/01/2020   Malaise and fatigue 10/04/2020   Mixed hyperlipidemia 02/01/2016   Last Assessment & Plan:  Relevant Hx: Course: Daily Update: Today's Plan:there is going to be fasting lipids done   Electronically signed by: [name], NP 02/16/16 2058   Moderate protein-calorie malnutrition (HCC) 08/09/2022   Multiple falls 08/15/2017   Nocturnal oxygen desaturation    Non-rheumatic mitral regurgitation 01/08/2016   Overview:  Mild   Osteoporosis 05/18/2011   Other dietary vitamin B12 deficiency anemia 04/15/2018   Other spondylosis with radiculopathy, lumbar region 06/01/2020   Pacemaker 05/18/2011   Manufacturer of Device:  Medtronic  Type of Device: Single Chamber Pacemaker       Personal history of COVID-19 06/08/2021   Posterior vitreous detachment of both eyes 06/26/2016   Presence of cardiac pacemaker 05/18/2011   Pressure injury of skin of dorsum of right foot 04/10/2018   Pseudophakia of both eyes 11/08/2015   PVD (posterior vitreous detachment), both eyes 11/08/2015   Retinal hemorrhage of both eyes 06/26/2016   Rib pain on right side 12/27/2017   S/P revision of total knee 08/10/2013   S/P total knee arthroplasty 12/29/2013   Shortness of breath 12/30/2017   Supplemental oxygen dependent 03/21/2018    Past Surgical History:  Procedure Laterality Date   ABDOMINAL HYSTERECTOMY     BACK SURGERY     FEMUR FRACTURE SURGERY Right    FINGER SURGERY Right    5 finger   PACEMAKER INSERTION     PR PART PALMAR FASCIEC,OPEN 1 DIGIT Left 10/30/2016      REPLACEMENT TOTAL KNEE Right    REVISION TOTAL KNEE ARTHROPLASTY Right    SKIN GRAFT     TONSILLECTOMY     TUBAL LIGATION  Current Medications: Current Meds  Medication Sig   acetaminophen (TYLENOL) 325 MG tablet Take 650 mg by mouth every 6 (six) hours as needed for mild pain.   albuterol (PROVENTIL HFA;VENTOLIN HFA) 108 (90 BASE) MCG/ACT inhaler Inhale 2 puffs into the lungs every 4 (four) hours as needed for wheezing or shortness of breath.   Ascorbic Acid (VITAMIN C) 1000 MG tablet Take 1 tablet by mouth daily.   Biotin 1 MG CAPS Take 1 capsule by mouth daily.   Cholecalciferol (VITAMIN D3) 125 MCG (5000 UT) CAPS Take 1 capsule by mouth daily.    CRANBERRY EXTRACT PO Take 1 tablet by mouth daily.   cyanocobalamin (,VITAMIN B-12,) 1000 MCG/ML injection Inject 1,000 mcg into the muscle every 14 (fourteen) days.    denosumab (PROLIA) 60 MG/ML SOLN injection Inject 60 mg into the skin every 6 (six) months.   Dextran 70-Hypromellose (ARTIFICIAL TEARS PF OP) Place 1 drop into both eyes 3 (three) times daily.   Docusate Calcium (STOOL SOFTENER PO)  Take 2 tablets by mouth daily.   ENTRESTO 97-103 MG Take 1 tablet by mouth 2 (two) times daily.   famotidine (PEPCID) 20 MG tablet Take 20 mg by mouth 2 (two) times daily.   fexofenadine (ALLEGRA) 180 MG tablet Take 180 mg by mouth daily.   furosemide (LASIX) 20 MG tablet 20 mg twice daily for 1 week then take 1 daily alternating with twice daily. (Patient taking differently: Take 20 mg by mouth 2 (two) times daily. 20 mg twice daily for 1 week then take 1 daily alternating with twice daily.)   Garlic 1500 MG CAPS Take 1,500 mg by mouth daily.   meclizine (ANTIVERT) 25 MG tablet Take 1 tablet by mouth as needed for dizziness.   Omega-3 Fatty Acids (OMEGA-3 FISH OIL PO) Take 1 capsule by mouth daily.   omeprazole (PRILOSEC) 40 MG capsule Take 40 mg by mouth every morning.   OVER THE COUNTER MEDICATION Take 1 tablet by mouth daily. NERVIVE   polyethylene glycol powder (GLYCOLAX/MIRALAX) 17 GM/SCOOP powder Take 1 Container by mouth daily as needed for mild constipation.   predniSONE (DELTASONE) 5 MG tablet Take 5 mg by mouth daily with breakfast.   primidone (MYSOLINE) 250 MG tablet Take 250 mg by mouth in the morning and at bedtime.   Probiotic Product (PROBIOTIC DAILY PO) Take 1 tablet by mouth daily.   Propylene Glycol 0.6 % SOLN 1 drop as needed.   Red Yeast Rice Extract 600 MG CAPS Take 1 capsule by mouth daily.   traMADol (ULTRAM) 50 MG tablet Take 50 mg by mouth every 6 (six) hours as needed for moderate pain.    umeclidinium-vilanterol (ANORO ELLIPTA) 62.5-25 MCG/INH AEPB Inhale 1 puff into the lungs daily.   vitamin E 1000 UNIT capsule Take 1 capsule by mouth daily.   [DISCONTINUED] ondansetron (ZOFRAN-ODT) 4 MG disintegrating tablet Take 4 mg by mouth every 8 (eight) hours as needed for nausea or vomiting.   [DISCONTINUED] triamcinolone acetonide (TRIESENCE) 40 MG/ML SUSP Inject 40 mg into the articular space as needed for pain.     Allergies:   Lidocaine hcl, Procaine, Propranolol,  Codeine, Epinephrine, Gabapentin, Levofloxacin, Other, Latex, and Sulfamethoxazole   Social History   Socioeconomic History   Marital status: Divorced    Spouse name: Not on file   Number of children: Not on file   Years of education: Not on file   Highest education level: Not on file  Occupational History   Not  on file  Tobacco Use   Smoking status: Former    Packs/day: 1.00    Years: 35.00    Total pack years: 35.00    Types: Cigarettes    Quit date: 10/22/1985    Years since quitting: 36.8   Smokeless tobacco: Never  Vaping Use   Vaping Use: Never used  Substance and Sexual Activity   Alcohol use: No   Drug use: No   Sexual activity: Not on file  Other Topics Concern   Not on file  Social History Narrative   Not on file   Social Determinants of Health   Financial Resource Strain: Not on file  Food Insecurity: Not on file  Transportation Needs: Not on file  Physical Activity: Not on file  Stress: Not on file  Social Connections: Not on file     Family History: The patient's family history includes ALS in her mother; Stroke in her sister; Throat cancer in her sister; Ulcers in her father. ROS:   Please see the history of present illness.    All 14 point review of systems negative except as described per history of present illness  EKGs/Labs/Other Studies Reviewed:      Recent Labs: 12/26/2021: BUN 11; Creatinine, Ser 0.61; NT-Pro BNP 838; Potassium 4.2; Sodium 140  Recent Lipid Panel No results found for: "CHOL", "TRIG", "HDL", "CHOLHDL", "VLDL", "LDLCALC", "LDLDIRECT"  Physical Exam:    VS:  BP 114/76 (BP Location: Left Arm, Patient Position: Sitting)   Pulse 77   Ht 5\' 5"  (1.651 m)   Wt 127 lb 9.6 oz (57.9 kg)   SpO2 92%   BMI 21.23 kg/m     Wt Readings from Last 3 Encounters:  08/29/22 127 lb 9.6 oz (57.9 kg)  03/30/22 127 lb (57.6 kg)  12/26/21 127 lb (57.6 kg)     GEN:  Well nourished, well developed in no acute distress HEENT: Normal NECK:  No JVD; No carotid bruits LYMPHATICS: No lymphadenopathy CARDIAC: RRR, no murmurs, no rubs, no gallops RESPIRATORY: Very poor air entry bilaterally no wheezes no rhonchi ABDOMEN: Soft, non-tender, non-distended MUSCULOSKELETAL:  No edema; No deformity  SKIN: Warm and dry LOWER EXTREMITIES: no swelling NEUROLOGIC:  Alert and oriented x 3 PSYCHIATRIC:  Normal affect   ASSESSMENT:    1. Chronic diastolic heart failure (HCC)   2. Chronic atrial fibrillation (HCC)   3. Hypertensive heart disease with heart failure (HCC)   4. Chronic bronchitis, unspecified chronic bronchitis type (HCC)    PLAN:    In order of problems listed above:  Chronic diastolic congestive heart failure seems to be compensated on the physical exam.  Negative proBNP as well as Chem-7 we will continue present management I instructed him 1 more time about weight management she is very good in particular about this so she check her weight every single day. Permanent atrial fibrillation.  Overall stable from that point review.  She is not anticoagulated and I understand this is because of fragility. COPD: That being followed by internal medicine team and pulmonary.  Overall she is sick lady with combination of COPD and congestive heart failure as well as chronic atrial fibrillation.  Look like well managed.  We will see her back in the office in 3 months   Medication Adjustments/Labs and Tests Ordered: Current medicines are reviewed at length with the patient today.  Concerns regarding medicines are outlined above.  No orders of the defined types were placed in this encounter.  Medication changes: No  orders of the defined types were placed in this encounter.   Signed, Georgeanna Leaobert J. Lajuan Godbee, MD, [facility] 08/29/2022 2:00 PM    [facility]

## 2022-08-30 LAB — PRO B NATRIURETIC PEPTIDE: NT-Pro BNP: 685 pg/mL (ref 0–738)

## 2022-08-30 LAB — BASIC METABOLIC PANEL
BUN/Creatinine Ratio: 25 (ref 12–28)
BUN: 13 mg/dL (ref 8–27)
CO2: 31 mmol/L — ABNORMAL HIGH (ref 20–29)
Calcium: 9.6 mg/dL (ref 8.7–10.3)
Chloride: 92 mmol/L — ABNORMAL LOW (ref 96–106)
Creatinine, Ser: 0.52 mg/dL — ABNORMAL LOW (ref 0.57–1.00)
Glucose: 82 mg/dL (ref 70–99)
Potassium: 4.7 mmol/L (ref 3.5–5.2)
Sodium: 141 mmol/L (ref 134–144)
eGFR: 90 mL/min/{1.73_m2} (ref >59)

## 2022-09-05 ENCOUNTER — Other Ambulatory Visit: Payer: Self-pay | Admitting: Cardiology

## 2022-09-10 ENCOUNTER — Telehealth: Payer: Self-pay

## 2022-09-10 ENCOUNTER — Encounter: Payer: Self-pay | Admitting: Cardiology

## 2022-09-10 ENCOUNTER — Ambulatory Visit: Payer: Medicare Other | Attending: Cardiology | Admitting: Cardiology

## 2022-09-10 VITALS — BP 110/62 | HR 69 | Ht 65.0 in | Wt 127.8 lb

## 2022-09-10 DIAGNOSIS — I4821 Permanent atrial fibrillation: Secondary | ICD-10-CM | POA: Diagnosis not present

## 2022-09-10 DIAGNOSIS — Z950 Presence of cardiac pacemaker: Secondary | ICD-10-CM | POA: Diagnosis not present

## 2022-09-10 DIAGNOSIS — D6869 Other thrombophilia: Secondary | ICD-10-CM

## 2022-09-10 LAB — CUP PACEART INCLINIC DEVICE CHECK
Battery Impedance: 6738 Ohm
Battery Remaining Longevity: 2 mo
Battery Voltage: 2.64 V
Brady Statistic RV Percent Paced: 100 %
Date Time Interrogation Session: 20231120120546
Implantable Lead Connection Status: 753985
Implantable Lead Implant Date: 20050802
Implantable Lead Location: 753860
Implantable Lead Model: 4076
Implantable Pulse Generator Implant Date: 20140717
Lead Channel Impedance Value: 0 Ohm
Lead Channel Impedance Value: 564 Ohm
Lead Channel Pacing Threshold Amplitude: 0.375 V
Lead Channel Pacing Threshold Pulse Width: 0.4 ms
Lead Channel Setting Pacing Amplitude: 2.5 V
Lead Channel Setting Pacing Pulse Width: 0.4 ms
Lead Channel Setting Sensing Sensitivity: 4 mV
Zone Setting Status: 755011

## 2022-09-10 NOTE — Patient Instructions (Signed)
Medication Instructions:  Your physician recommends that you continue on your current medications as directed. Please refer to the Current Medication list given to you today.  *If you need a refill on your cardiac medications before your next appointment, please call your pharmacy*   Lab Work: None ordered   Testing/Procedures: None ordered   Follow-Up: At [facility], you and your health needs are our priority.  As part of our continuing mission to provide you with exceptional heart care, we have created designated Provider Care Teams.  These Care Teams include your primary Cardiologist (physician) and Advanced Practice Providers (APPs -  Physician Assistants and Nurse Practitioners) who all work together to provide you with the care you need, when you need it.  Your next appointment:   To be   determined  --- we will notify you in several months when it is time to schedule a pacemaker battery change.  The format for your next appointment:   In Person  Provider:   Loman Brooklyn, MD    Thank you for choosing [facility]!!   Dory Horn, RN [phone]  Other Instructions   Important Information About Sugar

## 2022-09-10 NOTE — Progress Notes (Signed)
Electrophysiology Office Note   Date:  09/10/2022   ID:  Kimberly Raymond, DOB 02/29/1934, MRN [id number]  PCP:  Gordan PaymentGrisso, Greg A., MD  Cardiologist:  Dulce SellarMunley Primary Electrophysiologist:  [name], MD    Chief Complaint: pacemaker   History of Present Illness: Kimberly Raymond is a 88 y.o. female who is being seen today for the evaluation of pacemaker at the request of Gordan PaymentGrisso, Greg A., MD. Presenting today for electrophysiology evaluation.  She has a history seen for COPD, proximal LAD heart failure, permanent atrial fibrillation.  She is status post AV node ablation with Medtronic dual-chamber pacemaker implanted in 2005.  Today, denies symptoms of palpitations, chest pain, shortness of breath, orthopnea, PND, lower extremity edema, claudication, dizziness, presyncope, syncope, bleeding, or neurologic sequela. The patient is tolerating medications without difficulties.     Past Medical History:  Diagnosis Date   Acute bronchitis, complicated 09/11/2018   Acute exacerbation of COPD with asthma (HCC) 02/01/2016   Last Assessment & Plan:  Formatting of this note might be different from the original. Relevant Hx: Course: Daily Update: Today's Plan:she is going to have labs, and CXR as well as she is going to have oral abx called in and she is going to have BNP and pulmonary referral. She is not using any oxygen now as she sent it back due to the cost , she has not been using an inhaled steroid as she only go   Age-related osteoporosis without current pathological fracture 05/18/2011   Last Assessment & Plan:  Relevant Hx: Course: Daily Update: Today's Plan:she is following with Dr. Albertha Gheeebecca Bassett for this and recieves prolia twice a year and is due for this in May  Electronically signed by: Krystal ClarkMelissa Joyce Brown-Patram, NP 02/16/16 2101   Allergic rhinitis    Amblyopia, right eye 11/08/2015   Aortic atherosclerosis (HCC) 02/17/2018   Arthritis 05/18/2011   Branch retinal vein  occlusion of left eye 11/08/2015   Cardiac pacemaker in situ 05/18/2011   Cerebral thrombosis with cerebral infarction (HCC) 05/18/2011   Chronic atrial fibrillation (HCC) 11/22/2015   Overview:  With His ablation and permanent pacemaker for rate control, CHADS2 vasc score= 7  Overview:  Overview:  With His ablation and permanent pacemaker for rate control, CHADS2 vasc score= 7  Last Assessment & Plan:  Relevant Hx: Course: Daily Update: Today's Plan:she has pacemaker and is no longer taking eliquis   Electronically signed by: Krystal ClarkMelissa Joyce Brown-Patram, NP 02/16/16 2059   Chronic bilateral back pain 08/14/2017   Chronic diastolic heart failure (HCC) 11/22/2015   Chronic edema 04/01/2017   Chronic obstructive pulmonary disease (HCC) 10/18/2016   Chronic pain of left knee 11/22/2020   Chronic pansinusitis 05/22/2018   Chronic respiratory failure with hypoxia (HCC) 03/06/2018   Chronic rhinitis 05/22/2018   Contracture of finger joint, left 10/30/2016   COPD (chronic obstructive pulmonary disease) (HCC)    Coronary artery calcification seen on CAT scan 04/01/2018   Seen on CT scan March 2019 with three-vessel calcification   Coronary artery disease involving native coronary artery of native heart with angina pectoris (HCC) 04/01/2018   Formatting of this note might be different from the original. Seen on CT scan March 2019 with three-vessel calcification   Costochondral chest pain 11/22/2015   Dizziness 05/22/2016   Last Assessment & Plan:  She is going to have meclizine sent in for her and she is aware of the need to have safety due to sleepy effect  Dupuytren's contracture of left hand 10/30/2016   Epiretinal membrane (ERM) of left eye 11/08/2015   Essential hypertension 02/01/2016   Last Assessment & Plan:  Relevant Hx: Course: Daily Update: Today's Plan:this is fair for her right now and with her being sick it is not going to be ideal right now [name] follow  Electronically signed by:  [name], NP 02/16/16 2057   Essential tremor 05/18/2011   Extensor tendonitis of foot 12/25/2018   Gait abnormality 08/15/2017   High risk medication use 05/30/2017   Hip joint pain 12/29/2013   History of dizziness 05/22/2016   Last Assessment & Plan:  She is going to have meclizine sent in for her and she is aware of the need to have safety due to sleepy effect   Hypertensive heart disease with heart failure (HCC) 11/22/2015   Hypertensive retinopathy of both eyes 11/08/2015   Last Assessment & Plan:  Relevant Hx: Course: Daily Update: Today's Plan:she is following with specialist for this currently  Electronically signed by: [name], NP 02/16/16 2059   Iron deficiency 05/22/2018   Laryngopharyngeal reflux    Left epiretinal membrane 06/26/2016   Left foot pain 04/08/2014   Lumbar facet joint syndrome 12/16/2020   Lumbar radiculopathy, right 06/01/2020   Malaise and fatigue 10/04/2020   Mixed hyperlipidemia 02/01/2016   Last Assessment & Plan:  Relevant Hx: Course: Daily Update: Today's Plan:there is going to be fasting lipids done   Electronically signed by: [name], NP 02/16/16 2058   Moderate protein-calorie malnutrition (HCC) 08/09/2022   Multiple falls 08/15/2017   Nocturnal oxygen desaturation    Non-rheumatic mitral regurgitation 01/08/2016   Overview:  Mild   Osteoporosis 05/18/2011   Other dietary vitamin B12 deficiency anemia 04/15/2018   Other spondylosis with radiculopathy, lumbar region 06/01/2020   Pacemaker 05/18/2011   Manufacturer of Device: Medtronic  Type of Device: Single Chamber Pacemaker       Personal history of COVID-19 06/08/2021   Posterior vitreous detachment of both eyes 06/26/2016   Presence of cardiac pacemaker 05/18/2011   Pressure injury of skin of dorsum of right foot 04/10/2018   Pseudophakia of both eyes 11/08/2015   PVD (posterior vitreous detachment), both eyes 11/08/2015   Retinal  hemorrhage of both eyes 06/26/2016   Rib pain on right side 12/27/2017   S/P revision of total knee 08/10/2013   S/P total knee arthroplasty 12/29/2013   Shortness of breath 12/30/2017   Supplemental oxygen dependent 03/21/2018   Past Surgical History:  Procedure Laterality Date   ABDOMINAL HYSTERECTOMY     BACK SURGERY     FEMUR FRACTURE SURGERY Right    FINGER SURGERY Right    5 finger   PACEMAKER INSERTION     PR PART PALMAR FASCIEC,OPEN 1 DIGIT Left 10/30/2016      REPLACEMENT TOTAL KNEE Right    REVISION TOTAL KNEE ARTHROPLASTY Right    SKIN GRAFT     TONSILLECTOMY     TUBAL LIGATION       Current Outpatient Medications  Medication Sig Dispense Refill   acetaminophen (TYLENOL) 325 MG tablet Take 650 mg by mouth every 6 (six) hours as needed for mild pain.     albuterol (PROVENTIL HFA;VENTOLIN HFA) 108 (90 BASE) MCG/ACT inhaler Inhale 2 puffs into the lungs every 4 (four) hours as needed for wheezing or shortness of breath.     Ascorbic Acid (VITAMIN C) 1000 MG tablet Take 1 tablet by mouth daily.  Biotin 1 MG CAPS Take 1 capsule by mouth daily.     Cholecalciferol (VITAMIN D3) 125 MCG (5000 UT) CAPS Take 1 capsule by mouth daily.      CRANBERRY EXTRACT PO Take 1 tablet by mouth daily.     cyanocobalamin (,VITAMIN B-12,) 1000 MCG/ML injection Inject 1,000 mcg into the muscle every 14 (fourteen) days.      denosumab (PROLIA) 60 MG/ML SOLN injection Inject 60 mg into the skin every 6 (six) months.     Dextran 70-Hypromellose (ARTIFICIAL TEARS PF OP) Place 1 drop into both eyes 3 (three) times daily.     Docusate Calcium (STOOL SOFTENER PO) Take 2 tablets by mouth daily.     ENTRESTO 97-103 MG Take 1 tablet by mouth 2 (two) times daily. 60 tablet 5   famotidine (PEPCID) 20 MG tablet Take 20 mg by mouth daily.     fexofenadine (ALLEGRA) 180 MG tablet Take 180 mg by mouth daily.     furosemide (LASIX) 20 MG tablet Take 20 mg by mouth daily and take 40 on alternating days      Garlic 1500 MG CAPS Take 1,500 mg by mouth daily.     meclizine (ANTIVERT) 25 MG tablet Take 1 tablet by mouth as needed for dizziness.     Omega-3 Fatty Acids (OMEGA-3 FISH OIL PO) Take 1 capsule by mouth daily.     omeprazole (PRILOSEC) 40 MG capsule Take 40 mg by mouth every morning.     OVER THE COUNTER MEDICATION Take 1 tablet by mouth daily. NERVIVE     predniSONE (DELTASONE) 5 MG tablet Take 5 mg by mouth daily with breakfast.     primidone (MYSOLINE) 250 MG tablet Take 250 mg by mouth in the morning and at bedtime.     Probiotic Product (PROBIOTIC DAILY PO) Take 1 tablet by mouth daily.     Propylene Glycol 0.6 % SOLN 1 drop as needed.     Red Yeast Rice Extract 600 MG CAPS Take 1 capsule by mouth daily.     traMADol (ULTRAM) 50 MG tablet Take 50 mg by mouth every 6 (six) hours as needed for moderate pain.      umeclidinium-vilanterol (ANORO ELLIPTA) 62.5-25 MCG/INH AEPB Inhale 1 puff into the lungs daily.     vitamin E 1000 UNIT capsule Take 1 capsule by mouth daily.     No current facility-administered medications for this visit.    Allergies:   Lidocaine hcl, Procaine, Propranolol, Codeine, Epinephrine, Gabapentin, Levofloxacin, Other, Latex, and Sulfamethoxazole   Social History:  The patient  reports that she quit smoking about 36 years ago. Her smoking use included cigarettes. She has a 35.00 pack-year smoking history. She has never used smokeless tobacco. She reports that she does not drink alcohol and does not use drugs.   Family History:  The patient's family history includes ALS in her mother; Stroke in her sister; Throat cancer in her sister; Ulcers in her father.   ROS:  Please see the history of present illness.   Otherwise, review of systems is positive for none.   All other systems are reviewed and negative.   PHYSICAL EXAM: VS:  BP 110/62   Pulse 69   Ht 5\' 5"  (1.651 m)   Wt 127 lb 12.8 oz (58 kg)   SpO2 96%   BMI 21.27 kg/m  , BMI Body mass index is 21.27  kg/m. GEN: Well nourished, well developed, in no acute distress  HEENT: normal  Neck:  no JVD, carotid bruits, or masses Cardiac: RRR; no murmurs, rubs, or gallops,no edema  Respiratory:  clear to auscultation bilaterally, normal work of breathing GI: soft, nontender, nondistended, + BS MS: no deformity or atrophy  Skin: warm and dry, device site well healed Neuro:  Strength and sensation are intact Psych: euthymic mood, full affect   Personal review of the device interrogation today. Results in [facility]  Recent Labs: 08/29/2022: BUN 13; Creatinine, Ser 0.52; NT-Pro BNP 685; Potassium 4.7; Sodium 141    Lipid Panel  No results found for: "CHOL", "TRIG", "HDL", "CHOLHDL", "VLDL", "LDLCALC", "LDLDIRECT"   Wt Readings from Last 3 Encounters:  09/10/22 127 lb 12.8 oz (58 kg)  08/29/22 127 lb 9.6 oz (57.9 kg)  03/30/22 127 lb (57.6 kg)      Other studies Reviewed: Additional studies/ records that were reviewed today include: TTE 02/04/20  Review of the above records today demonstrates:   1. Left ventricular ejection fraction, by estimation, is 45 to 50%. The  left ventricle has mildly decreased function. The left ventricle has no  regional wall motion abnormalities. There is mild concentric left  ventricular hypertrophy. Left ventricular  diastolic parameters are consistent with Grade I diastolic dysfunction  (impaired relaxation).   2. Right ventricular systolic function is mildly reduced. The right  ventricular size is moderately enlarged. Device lead seen in the right  ventricle.   3. Left atrial size was severely dilated.   4. Right atrial size was severely dilated. Device lead seen in the right  atrium.   5. The mitral valve is normal in structure. Mild to moderate mitral valve  regurgitation. No evidence of mitral stenosis.   6. Tricuspid valve regurgitation is moderate.   7. The aortic valve is tricuspid. Aortic valve regurgitation is not  visualized. No aortic  stenosis is present.   8. Moderate Pulmonary Hypertension. Pulmonary artery systolic pressure  54.9 mmHG.   9. Right to left shunt noted by color dopper (clip 60 & 61).  10. The inferior vena cava is dilated in size with <50% respiratory  variability, suggesting right atrial pressure of 15 mmHg.    ASSESSMENT AND PLAN:  1.  Current atrial fibrillation: Status post AV node ablation.  Currently on Eliquis 5 mg twice daily.  CHA2DS2-VASc 4.  Status post Medtronic dual-chamber pacemaker implanted in 2005.  Her device has 2 months until ERI.  We Yasseen Salls plan for pacemaker generator change once it is ERI.  Procedure has been discussed with the patient.  Risks include bleeding and infection.  She understands the risks and is agreed to the procedure.  2.  Pacemaker: Implanted in 2000.  Patient is device dependent due to history of AV node ablation.  3.  Hypertension: Currently well controlled  4.  Secondary hypercoagulable state: Currently on Eliquis for atrial relation as above  Current medicines are reviewed at length with the patient today.   The patient does not have concerns regarding her medicines.  The following changes were made today: None  Labs/ tests ordered today include:  No orders of the defined types were placed in this encounter.     Disposition:   FU with Brianda Beitler 1 year  Signed, Kasen Adduci [name], MD  09/10/2022 11:38 AM     [facility] [address] [place] [address] [phone] (office) [phone] (fax)

## 2022-09-10 NOTE — Telephone Encounter (Signed)
Spoke with daughter, Elita Quick, Per DPR . Results reviewed with pt as per Dr. Vanetta Shawl note.  Pt's daughter verbalized understanding and had no additional questions. Routed to PCP

## 2022-09-10 NOTE — Telephone Encounter (Signed)
Spoke with daughter, Pam, Per DPR . Results reviewed with pt as per Dr. Krasowski's note.  Pt's daughter verbalized understanding and had no additional questions. Routed to PCP 

## 2022-10-01 ENCOUNTER — Telehealth: Payer: Self-pay | Admitting: Cardiology

## 2022-10-01 NOTE — Telephone Encounter (Signed)
Patient was states she was returning call. Please advise .

## 2022-10-01 NOTE — Telephone Encounter (Signed)
Called patient and she reported that someone left her a message this morning on her phone. I explained that the [facility] office did not call her. She stated that she was supposed to do a remote check of her pacemaker last Thursday and she did not do it. I explained that they were probably calling to check on you from the [facility] office to have her do the remote check. She stated that she would follow up with the [employer]. Patient had no further questions at this time.

## 2022-10-02 NOTE — Addendum Note (Signed)
Addended by: Geralyn Flash D on: 10/02/2022 11:09 AM   Modules accepted: Level of Service

## 2022-10-02 NOTE — Progress Notes (Signed)
Remote pacemaker transmission.   

## 2022-10-08 ENCOUNTER — Telehealth: Payer: Self-pay | Admitting: Cardiology

## 2022-10-08 NOTE — Telephone Encounter (Signed)
Pt c/o swelling: STAT is pt has developed SOB within 24 hours  If swelling, where is the swelling located? Legs,ankles and feet  How much weight have you gained and in what time span?   Have you gained 3 pounds in a day or 5 pounds in a week?   Do you have a log of your daily weights (if so, list)?   Are you currently taking a fluid pill? Yes, they increased it from 20 mg of Lasix to 40 mg  Are you currently SOB?  She is on oxygen all the time  Have you traveled recently? No-  she was put on a sodium medicine while she was in the hospital, they continued it for a while at [facility]- They have stopped the sodium at this time

## 2022-10-08 NOTE — Telephone Encounter (Signed)
Spoke with Pam- per DPR- she stated that she would call with a number to call in the morning. Will send order in the morning. Daughter agreed and verbalized understanding. She had no further questions.

## 2022-10-09 NOTE — Telephone Encounter (Signed)
Daughter called back with the number to [facility] [phone]. Please advise

## 2022-10-09 NOTE — Telephone Encounter (Signed)
Called order in per Dr. Dulce Sellar to increase Lasix to 40mg  Bid until swelling resolves then resume 40mg  daily. Order faxed to [phone]

## 2022-10-23 NOTE — Telephone Encounter (Signed)
Spoke with Kimberly Raymond and advised once return to baseline weight to return to 40 mg daily. Kimberly Raymond verbalized understanding and had no additional questions.

## 2022-10-23 NOTE — Addendum Note (Signed)
Addended by: Truddie Hidden on: 10/23/2022 04:42 PM   Modules accepted: Orders

## 2022-10-23 NOTE — Telephone Encounter (Signed)
Pt's daughter is calling back stating that swelling has gone down and they would like a call back to discuss medication and if they should decrease lasix. Please advise.

## 2022-10-29 ENCOUNTER — Ambulatory Visit (INDEPENDENT_AMBULATORY_CARE_PROVIDER_SITE_OTHER): Payer: Medicare Other

## 2022-10-29 DIAGNOSIS — I4821 Permanent atrial fibrillation: Secondary | ICD-10-CM

## 2022-10-30 LAB — CUP PACEART REMOTE DEVICE CHECK
Battery Impedance: 7851 Ohm
Battery Remaining Longevity: 1 mo — CL
Battery Voltage: 2.6 V
Brady Statistic RV Percent Paced: 100 %
Date Time Interrogation Session: 20240109121028
Implantable Lead Connection Status: 753985
Implantable Lead Implant Date: 20050802
Implantable Lead Location: 753860
Implantable Lead Model: 4076
Implantable Pulse Generator Implant Date: 20140717
Lead Channel Impedance Value: 0 Ohm
Lead Channel Impedance Value: 548 Ohm
Lead Channel Pacing Threshold Amplitude: 0.5 V
Lead Channel Pacing Threshold Pulse Width: 0.4 ms
Lead Channel Setting Pacing Amplitude: 2.5 V
Lead Channel Setting Pacing Pulse Width: 0.4 ms
Lead Channel Setting Sensing Sensitivity: 4 mV
Zone Setting Status: 755011

## 2022-11-27 ENCOUNTER — Telehealth: Payer: Self-pay

## 2022-11-27 NOTE — Telephone Encounter (Signed)
The patient daughter did call back. I let her know that the patient did reach RRT. I told her Doylene Canning will give her a call to schedule an appointment to get her Gen change scheduled. The pt daughter want to be called about the appointment. Her phone number is [phone]. She states if she do not answer to leave a detail voicemail and she will call back.

## 2022-11-27 NOTE — Telephone Encounter (Signed)
Unscheduled manual transmission received. RRT triggered 11/19/22. Routing for further review. - JJB  Patient sees Dr. Curt Bears in [place]. Device is at replacement.  LMOVM for patient's daughter Jeannene Patella) okay per DPR  (unable to LM on patient's cell)  notifying them of time for device battery replacement (43mths left) and that scheduler would get in touch with them regarding an in office appt for consult to discus change out.

## 2022-11-28 NOTE — Progress Notes (Unsigned)
Cardiology Office Note:    Date:  11/29/2022   ID:  Kimberly Raymond, DOB 10/15/1934, MRN [id number]  PCP:  [name], MD  Cardiologist:  Shirlee More, MD    Referring MD: [name], MD    ASSESSMENT:    1. Hypertensive heart disease with heart failure ([place])   2. Chronic atrial fibrillation (HCC)   3. Permanent atrial fibrillation ([place])   4. Chronic obstructive pulmonary disease, unspecified COPD type ([place])   5. Frail elderly    PLAN:    In order of problems listed above:  Presently she is stable she is at her peak provide employee needs all home heart failure management system to measure lung water and avoid repeated admissions to the hospital Continue to restrict sodium with dilutional hyponatremia Rate control she is pacemaker dependent the family tells me she reached ERI today and she will require generator change I reassured him that we have a good safety margin and this can be done safely as an outpatient Compounding and she has severe underlying COPD and generalized frailty   Next appointment: 3 months       Medication Adjustments/Labs and Tests Ordered: Current medicines are reviewed at length with the patient today.  Concerns regarding medicines are outlined above.  No orders of the defined types were placed in this encounter.  No orders of the defined types were placed in this encounter.   Chief complaint I am frustrated with 2 hospitalizations with heart failure subsequent need to go to have you return home   History of Present Illness:    Kimberly Raymond is a 89 y.o. female with a hx of chronic atrial fibrillation with previous AV nodal ablation and permanent pacemaker with rate control COPD sleep apnea and hypertensive heart disease with heart failure mildly reduced ejection fraction 45 to 50% last seen 03/30/2022.  Compliance with diet, lifestyle and medications: Yes  Her daughter is present participates in her care and management decision  making Has had 2 admissions to the hospital with combined cardiac and pulmonary deterioration and was in heart failure brought her in last admission he became the predominant problem she gained somewhere in the range of 13 pounds required intravenous diuretics and then developed severe  hyponatremia.  Subsequently she was placed on oral sodium management but fortunately had good judgment and refused to take it back on her usual diuretic she has returned to normal intermittently her daughter gives an extra 20 mg of furosemide to manage weight gain and peripheral edema  I think she is ideally suited for the [place] home heart failure management system.  Allows [place] to measure walk long water from a distance and catch her before deteriorations causing admission to the hospital.  Her sodium management she restricts water  She is under the care of a pulmonologist for significant underlying COPD Past Medical History:  Diagnosis Date   Acute bronchitis, complicated 26/94/8546   Acute exacerbation of COPD with asthma ([place]) 02/01/2016   Last Assessment & Plan:  Formatting of this note might be different from the original. Relevant Hx: Course: Daily Update: Today's Plan:she is going to have labs, and CXR as well as she is going to have oral abx called in and she is going to have BNP and pulmonary referral. She is not using any oxygen now as she sent it back due to the cost , she has not been using an inhaled steroid as she only go   Age-related osteoporosis without  current pathological fracture 05/18/2011   Last Assessment & Plan:  Relevant Hx: Course: Daily Update: Today's Plan:she is following with Dr. Wandra Feinstein for this and recieves prolia twice a year and is due for this in May  Electronically signed by: Mayer Camel, NP 02/16/16 2101   Allergic rhinitis    Amblyopia, right eye 11/08/2015   Aortic atherosclerosis ([place]) 02/17/2018   Arthritis 05/18/2011   Branch retinal vein occlusion of  left eye 11/08/2015   Cardiac pacemaker in situ 05/18/2011   Cerebral thrombosis with cerebral infarction ([place]) 05/18/2011   Chronic atrial fibrillation ([place]) 11/22/2015   Overview:  With His ablation and permanent pacemaker for rate control, CHADS2 vasc score= 7  Overview:  Overview:  With His ablation and permanent pacemaker for rate control, CHADS2 vasc score= 7  Last Assessment & Plan:  Relevant Hx: Course: Daily Update: Today's Plan:she has pacemaker and is no longer taking eliquis   Electronically signed by: Mayer Camel, NP 02/16/16 2059   Chronic bilateral back pain 08/14/2017   Chronic diastolic heart failure ([place]) 11/22/2015   Chronic edema 04/01/2017   Chronic obstructive pulmonary disease ([place]) 10/18/2016   Chronic pain of left knee 11/22/2020   Chronic pansinusitis 05/22/2018   Chronic respiratory failure with hypoxia ([place]) 03/06/2018   Chronic rhinitis 05/22/2018   Contracture of finger joint, left 10/30/2016   COPD (chronic obstructive pulmonary disease) ([place])    Coronary artery calcification seen on CAT scan 04/01/2018   Seen on CT scan March 2019 with three-vessel calcification   Coronary artery disease involving native coronary artery of native heart with angina pectoris ([place]) 04/01/2018   Formatting of this note might be different from the original. Seen on CT scan March 2019 with three-vessel calcification   Costochondral chest pain 11/22/2015   Dizziness 05/22/2016   Last Assessment & Plan:  She is going to have meclizine sent in for her and she is aware of the need to have safety due to sleepy effect   Dupuytren's contracture of left hand 10/30/2016   Epiretinal membrane (ERM) of left eye 11/08/2015   Essential hypertension 02/01/2016   Last Assessment & Plan:  Relevant Hx: Course: Daily Update: Today's Plan:this is fair for her right now and with her being sick it is not going to be ideal right now will follow  Electronically signed by: Mayer Camel, NP 02/16/16 2057   Essential tremor 05/18/2011   Extensor tendonitis of foot 12/25/2018   Gait abnormality 08/15/2017   High risk medication use 05/30/2017   Hip joint pain 12/29/2013   History of dizziness 05/22/2016   Last Assessment & Plan:  She is going to have meclizine sent in for her and she is aware of the need to have safety due to sleepy effect   Hypertensive heart disease with heart failure ([place]) 11/22/2015   Hypertensive retinopathy of both eyes 11/08/2015   Last Assessment & Plan:  Relevant Hx: Course: Daily Update: Today's Plan:she is following with specialist for this currently  Electronically signed by: Mayer Camel, NP 02/16/16 2059   Iron deficiency 05/22/2018   Laryngopharyngeal reflux    Left epiretinal membrane 06/26/2016   Left foot pain 04/08/2014   Lumbar facet joint syndrome 12/16/2020   Lumbar radiculopathy, right 06/01/2020   Malaise and fatigue 10/04/2020   Mixed hyperlipidemia 02/01/2016   Last Assessment & Plan:  Relevant Hx: Course: Daily Update: Today's Plan:there is going to be fasting lipids done   Electronically signed by:  Mayer Camel, NP 02/16/16 2058   Moderate protein-calorie malnutrition ([place]) 08/09/2022   Multiple falls 08/15/2017   Nocturnal oxygen desaturation    Non-rheumatic mitral regurgitation 01/08/2016   Overview:  Mild   Osteoporosis 05/18/2011   Other dietary vitamin B12 deficiency anemia 04/15/2018   Other spondylosis with radiculopathy, lumbar region 06/01/2020   Pacemaker 05/18/2011   Manufacturer of Device: [employer]onic  Type of Device: Single Chamber Pacemaker       Personal history of COVID-19 06/08/2021   Posterior vitreous detachment of both eyes 06/26/2016   Presence of cardiac pacemaker 05/18/2011   Pressure injury of skin of dorsum of right foot 04/10/2018   Pseudophakia of both eyes 11/08/2015   PVD (posterior vitreous detachment), both eyes 11/08/2015   Retinal hemorrhage of both  eyes 06/26/2016   Rib pain on right side 12/27/2017   S/P revision of total knee 08/10/2013   S/P total knee arthroplasty 12/29/2013   Shortness of breath 12/30/2017   Supplemental oxygen dependent 03/21/2018    Past Surgical History:  Procedure Laterality Date   ABDOMINAL HYSTERECTOMY     BACK SURGERY     FEMUR FRACTURE SURGERY Right    FINGER SURGERY Right    5 finger   PACEMAKER INSERTION     PR PART PALMAR FASCIEC,OPEN 1 DIGIT Left 10/30/2016      REPLACEMENT TOTAL KNEE Right    REVISION TOTAL KNEE ARTHROPLASTY Right    SKIN GRAFT     TONSILLECTOMY     TUBAL LIGATION      Current Medications: Current Meds  Medication Sig   acetaminophen (TYLENOL) 325 MG tablet Take 650 mg by mouth every 6 (six) hours as needed for mild pain.   albuterol (PROAIR HFA) 108 (90 Base) MCG/ACT inhaler Inhale into the lungs. 1-2 puffs into lungs every 4 hours as needed for wheezing   Albuterol Sulfate 2.5 MG/0.5ML NEBU Inhale 3 mLs into the lungs every 6 (six) hours as needed (wheezing, shortness of breath).   Biotin 1 MG CAPS Take 1 capsule by mouth daily.   Cholecalciferol (VITAMIN D3) 125 MCG (5000 UT) CAPS Take 1 capsule by mouth daily.    CRANBERRY EXTRACT PO Take 1 tablet by mouth daily.   cyanocobalamin (,VITAMIN B-12,) 1000 MCG/ML injection Inject 1,000 mcg into the muscle every 14 (fourteen) days.    denosumab (PROLIA) 60 MG/ML SOLN injection Inject 60 mg into the skin every 6 (six) months.   Dextran 70-Hypromellose (ARTIFICIAL TEARS PF OP) Place 1 drop into both eyes 3 (three) times daily.   diclofenac Sodium (VOLTAREN) 1 % GEL Apply 2 g topically daily as needed (pain).   Docusate Calcium (STOOL SOFTENER PO) Take 2 tablets by mouth daily.   ENTRESTO 97-103 MG Take 1 tablet by mouth 2 (two) times daily.   famotidine (PEPCID) 20 MG tablet Take 20 mg by mouth daily.   ferrous sulfate 324 MG TBEC Take 1 tablet by mouth daily.   fexofenadine (ALLEGRA) 180 MG tablet Take 180 mg by mouth  daily.   furosemide (LASIX) 20 MG tablet Take 40 mg by mouth daily. Take 20 mg by mouth daily and take 40 on alternating days   Garlic 9323 MG CAPS Take 1,500 mg by mouth daily.   meclizine (ANTIVERT) 25 MG tablet Take 1 tablet by mouth as needed for dizziness.   NON FORMULARY Take 1 tablet by mouth daily. [facility]   olopatadine (PATADAY) 0.1 % ophthalmic solution Place 1 drop into both eyes daily.  Omega-3 Fatty Acids (OMEGA-3 FISH OIL PO) Take 1 capsule by mouth daily.   omeprazole (PRILOSEC) 40 MG capsule Take 40 mg by mouth every morning.   predniSONE (DELTASONE) 5 MG tablet Take 5 mg by mouth daily with breakfast.   primidone (MYSOLINE) 250 MG tablet Take 250 mg by mouth in the morning and at bedtime.   Probiotic Product (PROBIOTIC DAILY PO) Take 1 tablet by mouth daily.   Propylene Glycol 0.6 % SOLN 1 drop as needed.   traMADol (ULTRAM) 50 MG tablet Take 50 mg by mouth every 6 (six) hours as needed for moderate pain.    umeclidinium-vilanterol (ANORO ELLIPTA) 62.5-25 MCG/INH AEPB Inhale 1 puff into the lungs daily.   vitamin E 1000 UNIT capsule Take 1 capsule by mouth daily.     Allergies:   Lidocaine hcl, Procaine, Propranolol, Codeine, Epinephrine, Gabapentin, Levofloxacin, Other, Latex, and Sulfamethoxazole   Social History   Socioeconomic History   Marital status: Divorced    Spouse name: Not on file   Number of children: Not on file   Years of education: Not on file   Highest education level: Not on file  Occupational History   Not on file  Tobacco Use   Smoking status: Former    Packs/day: 1.00    Years: 35.00    Total pack years: 35.00    Types: Cigarettes    Quit date: 10/22/1985    Years since quitting: 37.1   Smokeless tobacco: Never  Vaping Use   Vaping Use: Never used  Substance and Sexual Activity   Alcohol use: No   Drug use: No   Sexual activity: Not on file  Other Topics Concern   Not on file  Social History Narrative   Not on file   Social  Determinants of Health   Financial Resource Strain: Not on file  Food Insecurity: Not on file  Transportation Needs: Not on file  Physical Activity: Not on file  Stress: Not on file  Social Connections: Not on file     Family History: The patient's family history includes ALS in her mother; Stroke in her sister; Throat cancer in her sister; Ulcers in her father. ROS:   Please see the history of present illness.    All other systems reviewed and are negative.  EKGs/Labs/Other Studies Reviewed:    The following studies were reviewed today:   Recent Labs: 08/29/2022: BUN 13; Creatinine, Ser 0.52; NT-Pro BNP 685; Potassium 4.7; Sodium 141  Recent Lipid Panel No results found for: "CHOL", "TRIG", "HDL", "CHOLHDL", "VLDL", "LDLCALC", "LDLDIRECT"  Physical Exam:    VS:  BP 130/68 (BP Location: Right Arm, Patient Position: Sitting)   Pulse 66   Ht 5\' 5"  (1.651 m)   Wt 131 lb (59.4 kg)   SpO2 92%   BMI 21.80 kg/m     Wt Readings from Last 3 Encounters:  11/29/22 131 lb (59.4 kg)  09/10/22 127 lb 12.8 oz (58 kg)  08/29/22 127 lb 9.6 oz (57.9 kg)     GEN: She looks quite frail well nourished, well developed in no acute distress HEENT: Normal NECK: No JVD; No carotid bruits LYMPHATICS: No lymphadenopathy CARDIAC: Distant heart sounds RRR, no murmurs, rubs, gallops RESPIRATORY: Diminished breath sounds ABDOMEN: Soft, non-tender, non-distended MUSCULOSKELETAL:  No edema; No deformity  SKIN: Warm and dry NEUROLOGIC:  Alert and oriented x 3 PSYCHIATRIC:  Normal affect   Seen with Leighton Ruff, CMA as chaperone   Signed, Shirlee More, MD  11/29/2022  2:36 PM    [facility]

## 2022-11-29 ENCOUNTER — Ambulatory Visit (INDEPENDENT_AMBULATORY_CARE_PROVIDER_SITE_OTHER): Payer: 59

## 2022-11-29 ENCOUNTER — Ambulatory Visit: Payer: 59 | Attending: Cardiology | Admitting: Cardiology

## 2022-11-29 ENCOUNTER — Ambulatory Visit: Payer: Medicare Other | Admitting: Cardiology

## 2022-11-29 ENCOUNTER — Encounter: Payer: Self-pay | Admitting: Cardiology

## 2022-11-29 VITALS — BP 130/68 | HR 66 | Ht 65.0 in | Wt 131.0 lb

## 2022-11-29 DIAGNOSIS — I4821 Permanent atrial fibrillation: Secondary | ICD-10-CM | POA: Diagnosis not present

## 2022-11-29 DIAGNOSIS — I110 Hypertensive heart disease with heart failure: Secondary | ICD-10-CM

## 2022-11-29 DIAGNOSIS — I482 Chronic atrial fibrillation, unspecified: Secondary | ICD-10-CM

## 2022-11-29 DIAGNOSIS — J449 Chronic obstructive pulmonary disease, unspecified: Secondary | ICD-10-CM | POA: Diagnosis not present

## 2022-11-29 DIAGNOSIS — R54 Age-related physical debility: Secondary | ICD-10-CM

## 2022-11-29 LAB — CUP PACEART REMOTE DEVICE CHECK
Battery Impedance: 9058 Ohm
Battery Voltage: 2.6 V
Brady Statistic RV Percent Paced: 100 %
Date Time Interrogation Session: 20240208121058
Implantable Lead Connection Status: 753985
Implantable Lead Implant Date: 20050802
Implantable Lead Location: 753860
Implantable Lead Model: 4076
Implantable Pulse Generator Implant Date: 20140717
Lead Channel Impedance Value: 0 Ohm
Lead Channel Impedance Value: 532 Ohm
Lead Channel Setting Pacing Amplitude: 2.5 V
Lead Channel Setting Pacing Pulse Width: 0.4 ms
Lead Channel Setting Sensing Sensitivity: 4 mV
Zone Setting Status: 755011

## 2022-11-29 NOTE — Discharge Summary (Signed)
This visit was accompanied by Madelene Kaatz, CMA.  

## 2022-11-29 NOTE — Patient Instructions (Signed)
Medication Instructions:  Your physician recommends that you continue on your current medications as directed. Please refer to the Current Medication list given to you today.  *If you need a refill on your cardiac medications before your next appointment, please call your pharmacy*   Lab Work: None If you have labs (blood work) drawn today and your tests are completely normal, you will receive your results only by: [place] (if you have MyChart) OR A paper copy in the mail If you have any lab test that is abnormal or we need to change your treatment, we will call you to review the results.   Testing/Procedures: None   Follow-Up: At [facility] you and your health needs are our priority.  As part of our continuing mission to provide you with exceptional heart care, we have created designated Provider Care Teams.  These Care Teams include your primary Cardiologist (physician) and Advanced Practice Providers (APPs -  Physician Assistants and Nurse Practitioners) who all work together to provide you with the care you need, when you need it.  We recommend signing up for the patient portal called "MyChart".  Sign up information is provided on this After Visit Summary.  MyChart is used to connect with patients for Virtual Visits (Telemedicine).  Patients are able to view lab/test results, encounter notes, upcoming appointments, etc.  Non-urgent messages can be sent to your provider as well.   To learn more about what you can do with MyChart, go to [email or web address].    Your next appointment:   3 month(s)  Provider:   Shirlee More, MD    Other Instructions Set-up [facility] Heart Failure Management  program.  This visit was accompanied by Leighton Ruff.

## 2022-12-05 NOTE — Progress Notes (Signed)
Remote pacemaker transmission.   

## 2022-12-05 NOTE — Addendum Note (Signed)
Addended by: Douglass Rivers D on: 12/05/2022 01:10 PM   Modules accepted: Level of Service

## 2022-12-12 ENCOUNTER — Encounter: Payer: Self-pay | Admitting: Cardiology

## 2022-12-20 NOTE — Progress Notes (Signed)
Remote pacemaker transmission.   

## 2022-12-21 NOTE — Telephone Encounter (Signed)
Pt does not need an appt, just needs to be scheduled for gen change. Forwarding to EP scheduler to arrange

## 2022-12-21 DEATH — deceased

## 2023-03-14 ENCOUNTER — Ambulatory Visit: Payer: 59 | Admitting: Cardiology
